# Patient Record
Sex: Female | Born: 1990 | Race: White | Hispanic: No | Marital: Single | Smoking: Current every day smoker
Health system: Southern US, Community
[De-identification: ages and names within clinical notes are randomized; demographics above are authoritative.]

## PROBLEM LIST (undated history)

## (undated) DIAGNOSIS — J982 Interstitial emphysema: Secondary | ICD-10-CM

## (undated) DIAGNOSIS — F1910 Other psychoactive substance abuse, uncomplicated: Secondary | ICD-10-CM

## (undated) DIAGNOSIS — J129 Viral pneumonia, unspecified: Secondary | ICD-10-CM

## (undated) DIAGNOSIS — F419 Anxiety disorder, unspecified: Secondary | ICD-10-CM

## (undated) HISTORY — DX: Interstitial emphysema: J98.2

## (undated) HISTORY — DX: Viral pneumonia, unspecified: J12.9

## (undated) HISTORY — DX: Anxiety disorder, unspecified: F41.9

## (undated) HISTORY — DX: Other psychoactive substance abuse, uncomplicated: F19.10

---
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
## 2012-12-29 ENCOUNTER — Telehealth: Payer: Self-pay | Admitting: Certified Nurse Midwife

## 2012-12-29 NOTE — Telephone Encounter (Signed)
Spoke with pt about bump. Pt noticed it about 1-2 weeks ago, then it went away. It came back a little bigger, pt squeezed it and something came out, but it hasn't gone away. Pt denies itch and it is only sore "when she messes with it." Offered OV tomorrow, but pt declined. Sched OV 01-03-13 with DL at 16:10.

## 2012-12-29 NOTE — Telephone Encounter (Signed)
Patient wants to ask nurse about a bump she has in her pelvic area. Declined appointment.

## 2012-12-30 ENCOUNTER — Encounter: Payer: Self-pay | Admitting: Certified Nurse Midwife

## 2013-01-03 ENCOUNTER — Ambulatory Visit (INDEPENDENT_AMBULATORY_CARE_PROVIDER_SITE_OTHER): Payer: BC Managed Care – PPO | Admitting: Certified Nurse Midwife

## 2013-01-03 ENCOUNTER — Encounter: Payer: Self-pay | Admitting: Certified Nurse Midwife

## 2013-01-03 VITALS — BP 102/62 | HR 80 | Resp 16 | Ht 63.5 in | Wt 122.0 lb

## 2013-01-03 DIAGNOSIS — B3731 Acute candidiasis of vulva and vagina: Secondary | ICD-10-CM

## 2013-01-03 DIAGNOSIS — L678 Other hair color and hair shaft abnormalities: Secondary | ICD-10-CM

## 2013-01-03 DIAGNOSIS — L731 Pseudofolliculitis barbae: Secondary | ICD-10-CM

## 2013-01-03 DIAGNOSIS — N760 Acute vaginitis: Secondary | ICD-10-CM

## 2013-01-03 DIAGNOSIS — B373 Candidiasis of vulva and vagina: Secondary | ICD-10-CM

## 2013-01-03 DIAGNOSIS — L738 Other specified follicular disorders: Secondary | ICD-10-CM

## 2013-01-03 MED ORDER — NYSTATIN-TRIAMCINOLONE 100000-0.1 UNIT/GM-% EX CREA: TOPICAL_CREAM | Freq: Two times a day (BID) | CUTANEOUS | Status: DC

## 2013-01-03 MED ORDER — METRONIDAZOLE 0.75 % VA GEL: 1.0000 | Freq: Every day | VAGINAL | Status: DC

## 2013-01-03 NOTE — Progress Notes (Signed)
21 y.o.SingleCaucasian female [id number] with a 14 day(s) history of the following:burning, discharge described as grey and milky and also has resolving vaginal bump Sexually active: yes Last sexual activity:10days ago. Pt also reports the following associated symptoms: slight pain with sexual activity. Patient has nottried over the counter treatment. No STD screening needed. No new personal products or thong use.  O: Healthy WDWN female Affect: normal, orientation x3    Exam:  ZOX:[id number], Urethra, Skene's normal, resolving ingrown hair sebaceous cyst noted in left crease of groin area. Not red, soft,hair noted in center. No enlarged lymph nodes                JXB:[id number]: copious, malodorous and grey in color, pH 5.0, wet prep done                Cx:  normal appearance and non tender                Uterus:normal size, non-tender, normal shape and consistency                Adnexa: normal adnexa and no mass, fullness, tenderness   Wet Prep shows: Positive skin yeast, negative vaginal yeast, positive for BV, negative for trich   A:Yeast dermatitis BV Resolving ingrown hair sebaceous cyst   P: Reviewed findings Rx Mycolog see order Rx Metrogel see order Discussed findings and instructed not to keep  squeezing area, rather soak with epsom salts until resolves.  Warning signs given.  Discussed not shaving frequently and clipping in this area to keep from occurring again. Questions addressed.  RV prn     Reviewed, TL

## 2013-02-10 DIAGNOSIS — J982 Interstitial emphysema: Secondary | ICD-10-CM

## 2013-02-10 DIAGNOSIS — J129 Viral pneumonia, unspecified: Secondary | ICD-10-CM

## 2013-02-10 HISTORY — DX: Viral pneumonia, unspecified: J12.9

## 2013-02-10 HISTORY — DX: Interstitial emphysema: J98.2

## 2013-03-03 ENCOUNTER — Emergency Department (HOSPITAL_COMMUNITY): Payer: BC Managed Care – PPO

## 2013-03-03 ENCOUNTER — Inpatient Hospital Stay (HOSPITAL_COMMUNITY)
Admission: EM | Admit: 2013-03-03 | Discharge: 2013-03-09 | DRG: 541 | Disposition: A | Discharge status: Home / Self Care | Payer: BC Managed Care – PPO | Attending: Critical Care Medicine | Admitting: Critical Care Medicine

## 2013-03-03 ENCOUNTER — Encounter (HOSPITAL_COMMUNITY): Payer: Self-pay

## 2013-03-03 DIAGNOSIS — R0682 Tachypnea, not elsewhere classified: Secondary | ICD-10-CM

## 2013-03-03 DIAGNOSIS — F411 Generalized anxiety disorder: Secondary | ICD-10-CM | POA: Diagnosis present

## 2013-03-03 DIAGNOSIS — B9789 Other viral agents as the cause of diseases classified elsewhere: Secondary | ICD-10-CM | POA: Diagnosis present

## 2013-03-03 DIAGNOSIS — J1289 Other viral pneumonia: Secondary | ICD-10-CM | POA: Diagnosis present

## 2013-03-03 DIAGNOSIS — E876 Hypokalemia: Secondary | ICD-10-CM | POA: Diagnosis present

## 2013-03-03 DIAGNOSIS — J45909 Unspecified asthma, uncomplicated: Secondary | ICD-10-CM | POA: Diagnosis present

## 2013-03-03 DIAGNOSIS — J939 Pneumothorax, unspecified: Secondary | ICD-10-CM | POA: Diagnosis present

## 2013-03-03 DIAGNOSIS — F17200 Nicotine dependence, unspecified, uncomplicated: Secondary | ICD-10-CM | POA: Diagnosis present

## 2013-03-03 DIAGNOSIS — J982 Interstitial emphysema: Principal | ICD-10-CM | POA: Diagnosis present

## 2013-03-03 DIAGNOSIS — R0902 Hypoxemia: Secondary | ICD-10-CM

## 2013-03-03 DIAGNOSIS — J9383 Other pneumothorax: Secondary | ICD-10-CM | POA: Diagnosis present

## 2013-03-03 DIAGNOSIS — R0603 Acute respiratory distress: Secondary | ICD-10-CM

## 2013-03-03 DIAGNOSIS — R000 Tachycardia, unspecified: Secondary | ICD-10-CM

## 2013-03-03 DIAGNOSIS — R509 Fever, unspecified: Secondary | ICD-10-CM

## 2013-03-03 DIAGNOSIS — J189 Pneumonia, unspecified organism: Secondary | ICD-10-CM | POA: Diagnosis present

## 2013-03-03 DIAGNOSIS — I498 Other specified cardiac arrhythmias: Secondary | ICD-10-CM | POA: Diagnosis not present

## 2013-03-03 DIAGNOSIS — J9600 Acute respiratory failure, unspecified whether with hypoxia or hypercapnia: Secondary | ICD-10-CM | POA: Diagnosis present

## 2013-03-03 LAB — CBC WITH DIFFERENTIAL/PLATELET
Eosinophils Absolute: 0 10*3/uL (ref 0.0–0.7)
Hemoglobin: 14.1 g/dL (ref 12.0–15.0)
Lymphocytes Relative: 5 % — ABNORMAL LOW (ref 12–46)
Lymphs Abs: 0.9 10*3/uL (ref 0.7–4.0)
MCH: 30.8 pg (ref 26.0–34.0)
Monocytes Relative: 1 % — ABNORMAL LOW (ref 3–12)
Neutrophils Relative %: 93 % — ABNORMAL HIGH (ref 43–77)
RBC: 4.58 MIL/uL (ref 3.87–5.11)

## 2013-03-03 LAB — URINALYSIS, ROUTINE W REFLEX MICROSCOPIC
Glucose, UA: NEGATIVE mg/dL
Ketones, ur: 40 mg/dL — AB
Leukocytes, UA: NEGATIVE
Nitrite: NEGATIVE
Protein, ur: 100 mg/dL — AB
Urobilinogen, UA: 0.2 mg/dL (ref 0.0–1.0)

## 2013-03-03 LAB — BASIC METABOLIC PANEL
Calcium: 9.4 mg/dL (ref 8.4–10.5)
Creatinine, Ser: 0.55 mg/dL (ref 0.50–1.10)
GFR calc Af Amer: >90 mL/min (ref >90)
Potassium: 3.1 mEq/L — ABNORMAL LOW (ref 3.5–5.1)

## 2013-03-03 LAB — LACTIC ACID, PLASMA: Lactic Acid, Venous: 3.7 mmol/L — ABNORMAL HIGH (ref 0.5–2.2)

## 2013-03-03 MED ORDER — DEXTROSE 5 % IV SOLN
1.0000 g | Freq: Once | INTRAVENOUS | Status: AC
  Administered 2013-03-03: 1 g via INTRAVENOUS
  Filled 2013-03-03: qty 10

## 2013-03-03 MED ORDER — IOHEXOL 300 MG/ML  SOLN
80.0000 mL | Freq: Once | INTRAMUSCULAR | Status: AC | PRN
  Administered 2013-03-03: 75 mL via INTRAVENOUS

## 2013-03-03 MED ORDER — MORPHINE SULFATE 4 MG/ML IJ SOLN
4.0000 mg | Freq: Once | INTRAMUSCULAR | Status: AC
  Administered 2013-03-03: 4 mg via INTRAVENOUS
  Filled 2013-03-03: qty 1

## 2013-03-03 MED ORDER — CEFTRIAXONE SODIUM 1 G IJ SOLR
1.0000 g | Freq: Once | INTRAMUSCULAR | Status: DC
  Filled 2013-03-03: qty 10

## 2013-03-03 MED ORDER — ALBUTEROL SULFATE (5 MG/ML) 0.5% IN NEBU
10.0000 mg | INHALATION_SOLUTION | Freq: Once | RESPIRATORY_TRACT | Status: AC
  Administered 2013-03-04: 10 mg via RESPIRATORY_TRACT
  Filled 2013-03-03: qty 2

## 2013-03-03 MED ORDER — SODIUM CHLORIDE 0.9 % IV BOLUS (SEPSIS)
1000.0000 mL | Freq: Once | INTRAVENOUS | Status: AC
  Administered 2013-03-03: 1000 mL via INTRAVENOUS

## 2013-03-03 NOTE — ED Provider Notes (Signed)
CSN: [id number]     Arrival date & time 03/03/13  2039 History     First MD Initiated Contact with Patient 03/03/13 2046     Chief Complaint  Patient presents with  . Shortness of Breath   (Consider location/radiation/quality/duration/timing/severity/associated sxs/prior Treatment) Patient is a 22 y.o. female presenting with shortness of breath. The history is provided by the patient and a relative.  Shortness of Breath Severity:  Moderate Onset quality:  Gradual Duration:  3 days Timing:  Constant Progression:  Worsening Chronicity:  New Relieved by:  Inhaler and rest Worsened by:  Activity, deep breathing and exertion Ineffective treatments:  None tried Associated symptoms: chest pain, cough and fever   Associated symptoms: no abdominal pain, no diaphoresis, no ear pain, no headaches, no neck pain, no rash, no sore throat, no vomiting and no wheezing     Past Medical History  Diagnosis Date  . Anxiety    History reviewed. No pertinent past surgical history. Family History  Problem Relation Age of Onset  . Hypertension Mother   . Diabetes Father   . Cancer Paternal Grandmother     liver/colon   History  Substance Use Topics  . Smoking status: Current Every Day Smoker -- 0.50 packs/day    Types: Cigarettes  . Smokeless tobacco: Not on file  . Alcohol Use: No   OB History   Grav Para Term Preterm Abortions TAB SAB Ect Mult Living   0 0 0 0 0 0 0 0 0 0      Review of Systems  Constitutional: Positive for fever and chills. Negative for diaphoresis and fatigue.  HENT: Negative for ear pain, congestion, sore throat, facial swelling, mouth sores, trouble swallowing, neck pain and neck stiffness.   Eyes: Negative.   Respiratory: Positive for cough, chest tightness and shortness of breath. Negative for apnea and wheezing.   Cardiovascular: Positive for chest pain and palpitations. Negative for leg swelling.  Gastrointestinal: Negative for nausea, vomiting, abdominal  pain, diarrhea and abdominal distention.  Genitourinary: Negative for hematuria, flank pain, vaginal discharge, difficulty urinating and menstrual problem.  Musculoskeletal: Negative for back pain and gait problem.  Skin: Negative for rash and wound.  Neurological: Negative for dizziness, tremors, seizures, syncope, facial asymmetry, numbness and headaches.  Psychiatric/Behavioral: Negative.   All other systems reviewed and are negative.    Allergies  Sulfa antibiotics  Home Medications   Current Outpatient Rx  Name  Route  Sig  Dispense  Refill  . acetaminophen (TYLENOL) 500 MG tablet   Oral   Take 500 mg by mouth every 6 (six) hours as needed for pain.         Marland Kitchen albuterol (PROVENTIL HFA;VENTOLIN HFA) 108 (90 BASE) MCG/ACT inhaler   Inhalation   Inhale 2 puffs into the lungs every 4 (four) hours as needed for wheezing.         Marland Kitchen azithromycin (ZITHROMAX Z-PAK) 250 MG tablet   Oral   Take 250-500 mg by mouth See admin instructions. Take 500 mg (2 tablets) on day 1, then 250 mg (1 tablet)  on days 2-5         . etonogestrel-ethinyl estradiol (NUVARING) 0.12-0.015 MG/24HR vaginal ring   Vaginal   Place 1 each vaginally every 28 (twenty-eight) days. Insert vaginally and leave in place for 3 consecutive weeks, then remove for 1 week.         Marland Kitchen ibuprofen (ADVIL,MOTRIN) 200 MG tablet   Oral   Take 400 mg by mouth  every 6 (six) hours as needed for pain.         . predniSONE (STERAPRED UNI-PAK) 10 MG tablet   Oral   Take 10-60 mg by mouth See admin instructions. Take 60 mg (6 tablets) on day 1, 50 mg (5 tablets) on day 2, 40 mg (4 tablets) on day 3, 30 mg (3 tablets) on day 4, 20 mg (2 tablets) on day 5 and 10 mg (1 tablet) on day 6          BP 127/61  Pulse 142  Temp(Src) 98.7 F (37.1 C)  Resp 24  SpO2 97%  LMP 02/19/2013 Physical Exam  Nursing note and vitals reviewed. Constitutional: She is oriented to person, place, and time. She appears well-developed and  well-nourished. No distress.  HENT:  Head: Normocephalic and atraumatic.  Right Ear: External ear normal.  Left Ear: External ear normal.  Nose: Nose normal.  Mouth/Throat: Oropharynx is clear and moist. No oropharyngeal exudate.  Eyes: Conjunctivae and EOM are normal. Pupils are equal, round, and reactive to light. Right eye exhibits no discharge. Left eye exhibits no discharge.  Neck: Normal range of motion. Neck supple. No JVD present. No tracheal deviation present. No thyromegaly present.  Cardiovascular: Regular rhythm, normal heart sounds and intact distal pulses.  Tachycardia present.  Exam reveals no gallop and no friction rub.   No murmur heard. Pulmonary/Chest: Accessory muscle usage present. Tachypnea noted. She is in respiratory distress. She has wheezes. She has rhonchi. She has rales. She exhibits no tenderness.  Diffuse loud breath sounds in both lungs throughout  Abdominal: Soft. Bowel sounds are normal. She exhibits no distension. There is no tenderness. There is no rebound and no guarding.  Musculoskeletal: Normal range of motion.  Lymphadenopathy:    She has no cervical adenopathy.  Neurological: She is alert and oriented to person, place, and time. No cranial nerve deficit. Coordination normal.  Skin: Skin is warm. No rash noted. She is not diaphoretic.  Psychiatric: She has a normal mood and affect. Her behavior is normal. Judgment and thought content normal.    ED Course   Procedures (including critical care time)  Labs Reviewed  CBC WITH DIFFERENTIAL - Abnormal; Notable for the following:    WBC 17.3 (*)    Neutrophils Relative % 93 (*)    Neutro Abs 16.1 (*)    Lymphocytes Relative 5 (*)    Monocytes Relative 1 (*)    All other components within normal limits  BASIC METABOLIC PANEL - Abnormal; Notable for the following:    Potassium 3.1 (*)    CO2 15 (*)    Glucose, Bld 158 (*)    All other components within normal limits  LACTIC ACID, PLASMA - Abnormal;  Notable for the following:    Lactic Acid, Venous 3.7 (*)    All other components within normal limits  URINALYSIS, ROUTINE W REFLEX MICROSCOPIC - Abnormal; Notable for the following:    Specific Gravity, Urine 1.039 (*)    Ketones, ur 40 (*)    Protein, ur 100 (*)    All other components within normal limits  URINE MICROSCOPIC-ADD ON - Abnormal; Notable for the following:    Squamous Epithelial / LPF FEW (*)    Bacteria, UA FEW (*)    All other components within normal limits  CULTURE, BLOOD (ROUTINE X 2)  CULTURE, BLOOD (ROUTINE X 2)  RAPID HIV SCREEN ([facility])  POCT PREGNANCY, URINE   Dg Chest 2 View  03/03/2013  CLINICAL DATA:  Shortness of breath. Smoker.  EXAM: CHEST  2 VIEW  COMPARISON:  None.  FINDINGS: Normal-sized heart. Patchy interstitial and airspace opacity in both lungs. Pneumomediastinum. Minimal scoliosis. No pleural fluid.  IMPRESSION: 1. Pneumomediastinum.  2. Bilateral interstitial and alveolar pneumonitis/pneumonia. This appearance can be seen with pneumocystis carinii pneumonia.   Electronically Signed   By: Gordan Payment   On: 03/03/2013 22:31   Ct Chest W Contrast  03/04/2013   CLINICAL DATA:  Wheezing. Pneumomediastinum. Interstitial and alveolar opacities on chest radiographs earlier today.  EXAM: CT CHEST WITH CONTRAST  TECHNIQUE: Multidetector CT imaging of the chest was performed during intravenous contrast administration.  CONTRAST:  75mL OMNIPAQUE IOHEXOL 300 MG/ML  SOLN  COMPARISON:  Chest radiographs obtained earlier today.  FINDINGS: Extensive pneumomediastinum. No pneumothorax. Patchy interstitial and alveolar opacities an both lungs, involving all lobes, most pronounced in the upper lobes and perihilar regions. No enlarged lymph nodes or pleural fluid. Normal sized heart. Multiple thoracic vertebral bodies have endplate depressions producing an H shape of the vertebral body.  IMPRESSION: 1. Extensive pneumomediastinum without pneumothorax. This extends into the  lower neck. 2. Bilateral multilobar pneumonitis. This is less patchy than normally seen with pneumocystis carinii pneumonia and, although this remains a possibility. Other possibilities include other infectious processes and drug reaction. 3. H-shaped vertebral bodies. This can be seen with sickle cell disease.   Electronically Signed   By: Gordan Payment   On: 03/04/2013 00:32   1. Pneumonia, community acquired   2. Hypoxia   3. Respiratory distress   4. Pneumomediastinum   5. Tachycardia   6. Tachypnea   7. Fever     MDM  22 yr old F pt w/ PMH of smoking here with SOB and tachypnea. Patient says she has been feelign worsening shortness of breath and CP for the past 4 days. Patient says she has had fever for the past 2 days. She went to see her PCP who thought she had bronchitis and prescribed prednisone, albuterol and azithromycin. She says she has not improved. Patient has sinus tachycardia to 150's breathing nearly 30 x per minute with diffuse rales, rhonchi and wheezing and looks to not be feeling well. Patient lungs sounds improved with albuterol and atrovent. Patient got solumedrol from EMS. Patient still tachycardic but it has improved with fluids. Patient with no signs of end organ damage or hypotension. Patient does have pneumomediastinum. This was discussed with Ct surgeon who recommends doing CT of the chest with contrast. Patient also with bilateral pneumonitis possibly from infections like PCP. Will order stat HIV screen and will tx for serious community acquired PNA with rocephin. Patient with leukocytosis, normal urine, sating 100% on 3 L [place]. Elevated LA  CT shows pneumomediastinum and multilobar PNA. Patient improving with continuous albuterol but still with tachypnea and tachycardia. Will admit the patient to the stepdown unit.   Date: 03/03/2013  Rate: 145  Rhythm: sinus tachycardia  QRS Axis: normal  Intervals: normal  ST/T Wave abnormalities: normal  Conduction  Disutrbances:none  Narrative Interpretation:   Old EKG Reviewed: none available  Case discussed with Dr. Benjamin Stain, MD 03/04/13 [phone]

## 2013-03-03 NOTE — ED Notes (Signed)
2045  Pt arrived to ER via EMS from [facility] with SOB.  Pt was seen there on Thursday morning and was diagnosed with bronchitis and given z-pack and rescue inhaler.  Pt has gotten worse since then and went back to the office today and was given 80 solumedrol IM and called EMS and EMS give the pt 10 albuterol and 1 Atrovent and 45 solumedrol IV and 1000 tylenol.  Pt wheezing and diminished in lower lobes.  Family at bedside

## 2013-03-04 ENCOUNTER — Inpatient Hospital Stay (HOSPITAL_COMMUNITY): Payer: BC Managed Care – PPO

## 2013-03-04 ENCOUNTER — Encounter (HOSPITAL_COMMUNITY): Payer: Self-pay | Admitting: Internal Medicine

## 2013-03-04 DIAGNOSIS — J982 Interstitial emphysema: Secondary | ICD-10-CM | POA: Diagnosis present

## 2013-03-04 DIAGNOSIS — J9600 Acute respiratory failure, unspecified whether with hypoxia or hypercapnia: Secondary | ICD-10-CM | POA: Diagnosis present

## 2013-03-04 DIAGNOSIS — R0602 Shortness of breath: Secondary | ICD-10-CM

## 2013-03-04 DIAGNOSIS — J189 Pneumonia, unspecified organism: Secondary | ICD-10-CM | POA: Diagnosis present

## 2013-03-04 DIAGNOSIS — R079 Chest pain, unspecified: Secondary | ICD-10-CM

## 2013-03-04 DIAGNOSIS — E876 Hypokalemia: Secondary | ICD-10-CM | POA: Diagnosis present

## 2013-03-04 LAB — RAPID URINE DRUG SCREEN, HOSP PERFORMED
Amphetamines: NOT DETECTED
Barbiturates: NOT DETECTED
Barbiturates: NOT DETECTED
Cocaine: NOT DETECTED
Cocaine: NOT DETECTED
Opiates: NOT DETECTED
Tetrahydrocannabinol: POSITIVE — AB

## 2013-03-04 LAB — RAPID HIV SCREEN (WH-MAU): Rapid HIV Screen: NONREACTIVE

## 2013-03-04 LAB — STREP PNEUMONIAE URINARY ANTIGEN: Strep Pneumo Urinary Antigen: NEGATIVE

## 2013-03-04 MED ORDER — LEVALBUTEROL HCL 0.63 MG/3ML IN NEBU
0.6300 mg | INHALATION_SOLUTION | Freq: Four times a day (QID) | RESPIRATORY_TRACT | Status: DC
  Administered 2013-03-04 – 2013-03-05 (×5): 0.63 mg via RESPIRATORY_TRACT
  Filled 2013-03-04 (×8): qty 3

## 2013-03-04 MED ORDER — HYDROCODONE-HOMATROPINE 5-1.5 MG/5ML PO SYRP: 5.0000 mL | ORAL_SOLUTION | ORAL | Status: DC | PRN

## 2013-03-04 MED ORDER — OXYCODONE HCL 5 MG PO TABS
5.0000 mg | ORAL_TABLET | ORAL | Status: DC | PRN
  Administered 2013-03-04 – 2013-03-07 (×5): 5 mg via ORAL
  Filled 2013-03-04 (×5): qty 1

## 2013-03-04 MED ORDER — POTASSIUM CHLORIDE CRYS ER 20 MEQ PO TBCR
40.0000 meq | EXTENDED_RELEASE_TABLET | Freq: Two times a day (BID) | ORAL | Status: AC
  Administered 2013-03-04 (×2): 40 meq via ORAL
  Filled 2013-03-04 (×2): qty 2

## 2013-03-04 MED ORDER — METHYLPREDNISOLONE SODIUM SUCC 125 MG IJ SOLR
60.0000 mg | Freq: Four times a day (QID) | INTRAMUSCULAR | Status: DC
  Administered 2013-03-04 – 2013-03-06 (×9): 60 mg via INTRAVENOUS
  Filled 2013-03-04 (×13): qty 0.96

## 2013-03-04 MED ORDER — HYDROCODONE-ACETAMINOPHEN 7.5-325 MG/15ML PO SOLN
10.0000 mL | ORAL | Status: DC | PRN
  Administered 2013-03-04 – 2013-03-07 (×7): 10 mL via ORAL
  Filled 2013-03-04 (×7): qty 15

## 2013-03-04 MED ORDER — DEXTROSE 5 % IV SOLN
500.0000 mg | INTRAVENOUS | Status: DC
  Administered 2013-03-04 – 2013-03-07 (×4): 500 mg via INTRAVENOUS
  Filled 2013-03-04 (×4): qty 500

## 2013-03-04 MED ORDER — BIOTENE DRY MOUTH MT LIQD
15.0000 mL | Freq: Two times a day (BID) | OROMUCOSAL | Status: DC
  Administered 2013-03-04 – 2013-03-08 (×9): 15 mL via OROMUCOSAL

## 2013-03-04 MED ORDER — AZITHROMYCIN 250 MG PO TABS
250.0000 mg | ORAL_TABLET | ORAL | Status: DC
  Administered 2013-03-04: 500 mg via ORAL
  Filled 2013-03-04: qty 2

## 2013-03-04 MED ORDER — HYDROCOD POLST-CHLORPHEN POLST 10-8 MG/5ML PO LQCR
5.0000 mL | Freq: Two times a day (BID) | ORAL | Status: DC | PRN
  Administered 2013-03-04: 5 mL via ORAL
  Filled 2013-03-04: qty 5

## 2013-03-04 MED ORDER — ACETAMINOPHEN 325 MG PO TABS: 650.0000 mg | ORAL_TABLET | Freq: Four times a day (QID) | ORAL | Status: DC | PRN

## 2013-03-04 MED ORDER — HYDROMORPHONE HCL PF 1 MG/ML IJ SOLN
1.0000 mg | Freq: Once | INTRAMUSCULAR | Status: AC
  Administered 2013-03-04: 1 mg via INTRAVENOUS
  Filled 2013-03-04: qty 1

## 2013-03-04 MED ORDER — HYDROMORPHONE HCL PF 1 MG/ML IJ SOLN: 1.0000 mg | INTRAMUSCULAR | Status: DC | PRN

## 2013-03-04 MED ORDER — MORPHINE SULFATE 2 MG/ML IJ SOLN
2.0000 mg | INTRAMUSCULAR | Status: DC | PRN
  Administered 2013-03-04: 2 mg via INTRAVENOUS
  Filled 2013-03-04 (×2): qty 1

## 2013-03-04 MED ORDER — BENZONATATE 100 MG PO CAPS
200.0000 mg | ORAL_CAPSULE | Freq: Three times a day (TID) | ORAL | Status: DC | PRN
  Administered 2013-03-04 (×2): 200 mg via ORAL
  Filled 2013-03-04 (×2): qty 2

## 2013-03-04 NOTE — Progress Notes (Signed)
PCCM  Pt seen in f/u after 2AM admit  Pt in no distress.  C/o cough and wheeze.  No prior asthma hx.  No hx of sick exposures except boyfriend with a cold.  Drug screen only pos THC. Has bilat infiltrates and pneumomediastinum   WBC elevated . No fever Pneumomediastinum  Chest: exp wheeze and rales  IMP Pneumonititis ?viral  Asthma exac Pneumomediastinum  Plan Add steroids  BD on sched IV azithro and rocephin Check strep pneumo and legionella urine antigen  Shan Levans Beeper  [phone]  Cell  [phone]  If no response or cell goes to voicemail, call beeper [phone]

## 2013-03-04 NOTE — ED Provider Notes (Signed)
I saw and evaluated the patient, reviewed the resident's note and I agree with the findings and plan and agree with their ECG interpretation. Patient with chest pain shortness of breath. Likely pneumonia x-ray. Has pneumomediastinum. Admitted to the hospital after discussion with cardiothoracic surgery. [employer]. Rubin Payor, MD 03/04/13 2259

## 2013-03-04 NOTE — H&P (Signed)
PULMONARY  / CRITICAL CARE MEDICINE  Name: Missey Hasley MRN: [id number] DOB: 1991-05-07    ADMISSION DATE:  03/03/2013 CONSULTATION DATE:  03/04/2013  REFERRING MD :  Dr. Rubin Payor PRIMARY SERVICE: Emergency Medicine  CHIEF COMPLAINT:  Chest pain or shortness of breath  BRIEF PATIENT DESCRIPTION: 22 year-old female with pneumomediastinum in the setting of bronchitis.  SIGNIFICANT EVENTS / STUDIES:  1. Chest CT 03/04/2013 demonstrates pneumomediastinum.  LINES / TUBES: 1. None  CULTURES: 1. None  ANTIBIOTICS: 1. None  HISTORY OF PRESENT ILLNESS:  Ms. Knisley is a 22 year old female with no significant past medical history who presented to [facility] this evening with chest pain and shortness of breath. The chest pain and shortness of breath began on Wednesday when she developed an "cold" with associated rhinorrhea, sore throat, cough, and wheezing. Her boyfriend was recently sick as well. She presented to her primary care physician who diagnosed her with bronchitis and started her with antibiotics and prednisone. This evening, the pain got worse and decided to present to the emergency room. She denies recent trauma or cocaine use. She has never had a spontaneous pneumothorax.  PAST MEDICAL HISTORY :  Past Medical History  Diagnosis Date  . Anxiety     History reviewed. No pertinent past surgical history.  Prior to Admission medications   Medication Sig Start Date End Date Taking? Authorizing Provider  acetaminophen (TYLENOL) 500 MG tablet Take 500 mg by mouth every 6 (six) hours as needed for pain.   Yes Historical Provider, MD  albuterol (PROVENTIL HFA;VENTOLIN HFA) 108 (90 BASE) MCG/ACT inhaler Inhale 2 puffs into the lungs every 4 (four) hours as needed for wheezing.   Yes Historical Provider, MD  azithromycin (ZITHROMAX Z-PAK) 250 MG tablet Take 250-500 mg by mouth See admin instructions. Take 500 mg (2 tablets) on day 1, then 250 mg (1 tablet)  on days 2-5   Yes  Historical Provider, MD  etonogestrel-ethinyl estradiol (NUVARING) 0.12-0.015 MG/24HR vaginal ring Place 1 each vaginally every 28 (twenty-eight) days. Insert vaginally and leave in place for 3 consecutive weeks, then remove for 1 week.   Yes Historical Provider, MD  ibuprofen (ADVIL,MOTRIN) 200 MG tablet Take 400 mg by mouth every 6 (six) hours as needed for pain.   Yes Historical Provider, MD  predniSONE (STERAPRED UNI-PAK) 10 MG tablet Take 10-60 mg by mouth See admin instructions. Take 60 mg (6 tablets) on day 1, 50 mg (5 tablets) on day 2, 40 mg (4 tablets) on day 3, 30 mg (3 tablets) on day 4, 20 mg (2 tablets) on day 5 and 10 mg (1 tablet) on day 6   Yes Historical Provider, MD    Allergies  Allergen Reactions  . Sulfa Antibiotics Itching    FAMILY HISTORY:  Family History  Problem Relation Age of Onset  . Hypertension Mother   . Diabetes Father   . Cancer Paternal Grandmother     liver/colon    SOCIAL HISTORY:  reports that she has been smoking Cigarettes.  She has been smoking about 0.50 packs per day. She does not have any smokeless tobacco history on file. She reports that she uses illicit drugs. She reports that she does not drink alcohol.  REVIEW OF SYSTEMS:  A 12 system review of systems was conducted and, unless otherwise specified in history of present illness, was negative.  PHYSICAL EXAM  VITAL SIGNS: Temp:  [98.7 F (37.1 C)] 98.7 F (37.1 C) (08/22 2039) Pulse Rate:  [131-152] 143 (  08/23 0215) Resp:  [18-42] 35 (08/23 0215) BP: (102-129)/(41-70) 120/49 mmHg (08/23 0215) SpO2:  [92 %-99 %] 94 % (08/23 0215)  HEMODYNAMICS:    VENTILATOR SETTINGS:    INTAKE / OUTPUT: Intake/Output   None     PHYSICAL EXAMINATION: General:   well-developed well-nourished female in no acute distress Neuro:   nonfocal HEENT:   sclera anicteric, conjunctiva pink, mucous membranes moist, oropharynx clear Neck:   trachea supple in midline, no lymphadenopathy or  JVD Cardiovascular:   regular rate rhythm, normal S1-S2, no murmurs rubs or gallops Lungs:   mild wheezing in the apices bilaterally, normal work of breathing Abdomen:   soft, nontender, nondistended, positive bowel sounds Musculoskeletal:   no clubbing cyanosis or Skin:   no rashes  LABS:  CBC Recent Labs     03/03/13  2107  WBC  17.3*  HGB  14.1  HCT  39.7  PLT  252    Coag's No results found for this basename: APTT, INR,  in the last 72 hours  BMET Recent Labs     03/03/13  2107  NA  139  K  3.1*  CL  105  CO2  15*  BUN  8  CREATININE  0.55  GLUCOSE  158*    Electrolytes Recent Labs     03/03/13  2107  CALCIUM  9.4    Sepsis Markers No results found for this basename: LACTICACIDVEN, PROCALCITON, O2SATVEN,  in the last 72 hours  ABG No results found for this basename: PHART, PCO2ART, PO2ART,  in the last 72 hours  Liver Enzymes No results found for this basename: AST, ALT, ALKPHOS, BILITOT, ALBUMIN,  in the last 72 hours  Cardiac Enzymes No results found for this basename: TROPONINI, PROBNP,  in the last 72 hours  Glucose No results found for this basename: GLUCAP,  in the last 72 hours  Imaging Dg Chest 2 View  03/03/2013   CLINICAL DATA:  Shortness of breath. Smoker.  EXAM: CHEST  2 VIEW  COMPARISON:  None.  FINDINGS: Normal-sized heart. Patchy interstitial and airspace opacity in both lungs. Pneumomediastinum. Minimal scoliosis. No pleural fluid.  IMPRESSION: 1. Pneumomediastinum.  2. Bilateral interstitial and alveolar pneumonitis/pneumonia. This appearance can be seen with pneumocystis carinii pneumonia.   Electronically Signed   By: Gordan Payment   On: 03/03/2013 22:31   Ct Chest W Contrast  03/04/2013   CLINICAL DATA:  Wheezing. Pneumomediastinum. Interstitial and alveolar opacities on chest radiographs earlier today.  EXAM: CT CHEST WITH CONTRAST  TECHNIQUE: Multidetector CT imaging of the chest was performed during intravenous contrast  administration.  CONTRAST:  75mL OMNIPAQUE IOHEXOL 300 MG/ML  SOLN  COMPARISON:  Chest radiographs obtained earlier today.  FINDINGS: Extensive pneumomediastinum. No pneumothorax. Patchy interstitial and alveolar opacities an both lungs, involving all lobes, most pronounced in the upper lobes and perihilar regions. No enlarged lymph nodes or pleural fluid. Normal sized heart. Multiple thoracic vertebral bodies have endplate depressions producing an H shape of the vertebral body.  IMPRESSION: 1. Extensive pneumomediastinum without pneumothorax. This extends into the lower neck. 2. Bilateral multilobar pneumonitis. This is less patchy than normally seen with pneumocystis carinii pneumonia and, although this remains a possibility. Other possibilities include other infectious processes and drug reaction. 3. H-shaped vertebral bodies. This can be seen with sickle cell disease.   Electronically Signed   By: Gordan Payment   On: 03/04/2013 00:32    EKG:  Pending CXR:  Chest radiograph is personally reviewed  by me. It demonstrates pneumomediastinum and mild diffuse groundglass opacities which are also seen on the CT.  ASSESSMENT / PLAN: Principal Problem:   Pneumomediastinum Active Problems:   Hypokalemia   Acute respiratory failure   PULMONARY A: 1. Acute hypoxic respiratory failure: This most likely represents a viral bronchitis versus a developing pneumonia. The severe associated cough likely limited in pneumomediastinum.  P:    Continue ceftriaxone/azithromycin coverage for community-acquired pneumonia  Stop albuterol nebulizers as they provoke cough  Tessalon Perles  Viral respiratory  CARDIOVASCULAR A:  1. Pneumomediastinum: This is most likely a result of severe cough in the setting of her bronchitis/pneumonia. She does have a history of cocaine use, however, indicates that her last use was several months ago. She denies recent trauma. A congenital lesion is possible as well.  P:    Supportive care with pain medication and supplemental oxygen as needed  Cough suppression  RENAL A:  1. Hypokalemia:   P:    Replete  GASTROINTESTINAL A: 1. No acute issue  HEMATOLOGIC A:   1. No acute issue  INFECTIOUS A: 1. No acute issue  ENDOCRINE A:   1. No acute issue  NEUROLOGIC A:  1. No acute issue  BEST PRACTICE / DISPOSITION Level of Care:  ICU Consultants:  Cardiothoracic Code Status:  Full Diet:  Regular DVT Px:  Ambulatory GI Px:  Not indicated Skin Integrity:  Intact Social / Family:  Parents in room with patient  TODAY'S SUMMARY:   I have personally obtained a history, examined the patient, evaluated laboratory and imaging results, formulated the assessment and plan and placed orders.  CRITICAL CARE: The patient is critically ill with multiple organ systems failure and requires high complexity decision making for assessment and support, frequent evaluation and titration of therapies, application of advanced monitoring technologies and extensive interpretation of multiple databases. Critical Care Time devoted to patient care services described in this note is 30 minutes.   Evalyn Casco, MD Pulmonary and Critical Care Medicine [facility] Pager: [phone]  03/04/2013, 2:34 AM

## 2013-03-05 ENCOUNTER — Inpatient Hospital Stay (HOSPITAL_COMMUNITY): Payer: BC Managed Care – PPO

## 2013-03-05 DIAGNOSIS — J982 Interstitial emphysema: Principal | ICD-10-CM

## 2013-03-05 DIAGNOSIS — J189 Pneumonia, unspecified organism: Secondary | ICD-10-CM

## 2013-03-05 DIAGNOSIS — J939 Pneumothorax, unspecified: Secondary | ICD-10-CM | POA: Diagnosis present

## 2013-03-05 DIAGNOSIS — J9600 Acute respiratory failure, unspecified whether with hypoxia or hypercapnia: Secondary | ICD-10-CM

## 2013-03-05 LAB — LEGIONELLA ANTIGEN, URINE

## 2013-03-05 LAB — BASIC METABOLIC PANEL
CO2: 22 mEq/L (ref 19–32)
GFR calc non Af Amer: >90 mL/min (ref >90)
Glucose, Bld: 135 mg/dL — ABNORMAL HIGH (ref 70–99)
Potassium: 3.6 mEq/L (ref 3.5–5.1)
Sodium: 136 mEq/L (ref 135–145)

## 2013-03-05 LAB — CBC
Hemoglobin: 12.3 g/dL (ref 12.0–15.0)
MCH: 30.1 pg (ref 26.0–34.0)
MCV: 86.8 fL (ref 78.0–100.0)
RBC: 4.08 MIL/uL (ref 3.87–5.11)

## 2013-03-05 MED ORDER — DEXTROSE 5 % IV SOLN
1.0000 g | INTRAVENOUS | Status: DC
  Administered 2013-03-05 – 2013-03-07 (×3): 1 g via INTRAVENOUS
  Filled 2013-03-05 (×3): qty 10

## 2013-03-05 MED ORDER — BENZONATATE 100 MG PO CAPS
200.0000 mg | ORAL_CAPSULE | Freq: Three times a day (TID) | ORAL | Status: DC
  Administered 2013-03-05 – 2013-03-08 (×12): 200 mg via ORAL
  Filled 2013-03-05 (×16): qty 2

## 2013-03-05 MED ORDER — LEVALBUTEROL HCL 0.63 MG/3ML IN NEBU
0.6300 mg | INHALATION_SOLUTION | RESPIRATORY_TRACT | Status: DC
  Administered 2013-03-05 – 2013-03-06 (×6): 0.63 mg via RESPIRATORY_TRACT
  Filled 2013-03-05 (×9): qty 3

## 2013-03-05 NOTE — Progress Notes (Signed)
PULMONARY  / CRITICAL CARE MEDICINE  Name: Valerie Castro MRN: [id number] DOB: May 09, 1991    ADMISSION DATE:  03/03/2013 CONSULTATION DATE:  03/04/2013  REFERRING MD :  Dr. Rubin Payor PRIMARY SERVICE: Emergency Medicine  CHIEF COMPLAINT:  Chest pain or shortness of breath  BRIEF PATIENT DESCRIPTION: 22 year-old female with pneumomediastinum in the setting of bronchitis.  SIGNIFICANT EVENTS / STUDIES:  1. Chest CT 03/04/2013 demonstrates pneumomediastinum.  LINES / TUBES: 1. None  CULTURES: 1. Urine strep pneum 8/23>>neg 2. Blood cultures 8/23>>  ANTIBIOTICS: 1. Azithromycin 8/23 2. Rocephin 8/24  Subjective: Pt states feels weaker today. Non prod cough  PHYSICAL EXAM  VITAL SIGNS: Temp:  [97.9 F (36.6 C)-98.9 F (37.2 C)] 98.2 F (36.8 C) (08/24 0732) Pulse Rate:  [90-138] 90 (08/24 0600) Resp:  [18-36] 32 (08/24 0600) BP: (110-151)/(49-78) 119/70 mmHg (08/24 0600) SpO2:  [88 %-96 %] 92 % (08/24 0752) Weight:  [57 kg (125 lb 10.6 oz)] 57 kg (125 lb 10.6 oz) (08/24 0500)  [place] oxygen 3L sats 89-90      INTAKE / OUTPUT: Intake/Output     08/23 0701 - 08/24 0700 08/24 0701 - 08/25 0700   P.O. 150    I.V. (mL/kg) 80 (1.4)    IV Piggyback 250    Total Intake(mL/kg) 480 (8.4)    Urine (mL/kg/hr) 2075 (1.5)    Total Output 2075     Net -1595            PHYSICAL EXAMINATION: General:   well-developed well-nourished female in no acute distress Neuro:   nonfocal HEENT:   sclera anicteric, conjunctiva pink, mucous membranes moist, oropharynx clear Neck:   trachea supple in midline, no lymphadenopathy or JVD Cardiovascular:   regular rate rhythm, normal S1-S2, no murmurs rubs or gallops Lungs:   Increased wheezing diffusely bilaterally Abdomen:   soft, nontender, nondistended, positive bowel sounds Musculoskeletal:   no clubbing cyanosis or Skin:   no rashes  LABS:  CBC Recent Labs     03/03/13  2107  03/05/13  0410  WBC  17.3*  17.5*  HGB  14.1  12.3  HCT   39.7  35.4*  PLT  252  270    Coag's No results found for this basename: APTT, INR,  in the last 72 hours  BMET Recent Labs     03/03/13  2107  03/05/13  0410  NA  139  136  K  3.1*  3.6  CL  105  104  CO2  15*  22  BUN  8  10  CREATININE  0.55  0.49*  GLUCOSE  158*  135*    Electrolytes Recent Labs     03/03/13  2107  03/05/13  0410  CALCIUM  9.4  9.1    Sepsis Markers No results found for this basename: LACTICACIDVEN, PROCALCITON, O2SATVEN,  in the last 72 hours  ABG No results found for this basename: PHART, PCO2ART, PO2ART,  in the last 72 hours  Liver Enzymes No results found for this basename: AST, ALT, ALKPHOS, BILITOT, ALBUMIN,  in the last 72 hours  Cardiac Enzymes No results found for this basename: TROPONINI, PROBNP,  in the last 72 hours  Glucose No results found for this basename: GLUCAP,  in the last 72 hours  Imaging  CXR:  Chest radiograph is personally reviewed by me. It demonstrates pneumomediastinum , tiny apical pneumothoraces and patchy  groundglass opacities which are also seen on the CT.  ASSESSMENT / PLAN: Principal  Problem:   Pneumomediastinum Active Problems:   Hypokalemia   Acute respiratory failure   Pneumonia   PULMONARY A: Acute hypoxic respiratory failure: This most likely represents a viral bronchitis versus a developing viral pneumonia. Pneumomediastinum and tiny apical pneumothoraces P:    Continue ceftriaxone/azithromycin coverage for community-acquired pneumonia  Cont xopenex neb meds increase to q4h  Flutter valve and IS  Tessalon Perles  Viral respiratory  CARDIOVASCULAR A:  1. Pneumomediastinum: This is most likely a result of severe cough in the setting of her bronchitis/pneumonia. She does have a history of cocaine use, however, indicates that her last use was several months ago. She denies recent trauma. A congenital lesion is possible as well.  NOTE drug screen neg except for THC  P:   Supportive  care with pain medication and supplemental oxygen as needed  Cough suppression  RENAL A:  1. Hypokalemia: resolved  P:    monitor  GASTROINTESTINAL A: 1. No acute issue, tol diet ok  HEMATOLOGIC A:   1. No acute issue  INFECTIOUS A: 1. BC neg. Strep pneumo Ag neg.   P:  Cont iv rocephin/azithromycin  ENDOCRINE A:   1. No acute issue  NEUROLOGIC A:  1. No acute issue  I have updated pts parents 8/23 and 8/24 at bedside  TODAY'S SUMMARY:  22 y.o.F with prob viral pneumonia, pneumomediastinum, pneumothoraces, bronchospasm, cough,  Air trapping/leak better, pneumonitis evolving Plan to cont IV ABX, steroids, BDs, cough suppression, add flutter/IS. Keep in ICU.  Not out of woods yet  I have personally obtained a history, examined the patient, evaluated laboratory and imaging results, formulated the assessment and plan and placed orders.  CRITICAL CARE: The patient is critically ill with multiple organ systems failure and requires high complexity decision making for assessment and support, frequent evaluation and titration of therapies, application of advanced monitoring technologies and extensive interpretation of multiple databases. Critical Care Time devoted to patient care services described in this note is 30 minutes.   Dorcas Carrow Beeper  [phone]  Cell  [phone]  If no response or cell goes to voicemail, call beeper [phone] Pulmonary and Critical Care Medicine [facility] Pager: [phone]  03/05/2013, 8:27 AM

## 2013-03-05 NOTE — Progress Notes (Signed)
  Subjective: 22 year old female smoker with a viral pneumonia and pneumomediastinum. Admitted with chest pain, productive cough and shortness of breath. Chest x-rays and CT scan chest all reviewed. She has mild pneumomediastinum and trace right greater than left pneumothorax. Significant inflammation of the distal airways. No prior history of asthma or recurrent pneumonia. No route recent episode of severe emesis heavy lifting or thoracic trauma. Patient is responding to bronchodilators antibiotics and IV steroids. Oxygen saturation satisfactory stable Objective: Vital signs in last 24 hours: Temp:  [98.1 F (36.7 C)-98.9 F (37.2 C)] 98.2 F (36.8 C) (08/24 1200) Pulse Rate:  [90-138] 97 (08/24 1200) Cardiac Rhythm:  [-] Sinus tachycardia (08/24 0800) Resp:  [18-36] 30 (08/24 1200) BP: (110-151)/(53-78) 123/66 mmHg (08/24 1200) SpO2:  [88 %-96 %] 93 % (08/24 1216) Weight:  [125 lb 10.6 oz (57 kg)] 125 lb 10.6 oz (57 kg) (08/24 0500)  Hemodynamic parameters for last 24 hours:    Intake/Output from previous day: 08/23 0701 - 08/24 0700 In: 480 [P.O.:150; I.V.:80; IV Piggyback:250] Out: 2075 [Urine:2075] Intake/Output this shift: Total I/O In: 125 [IV Piggyback:125] Out: 500 [Urine:500]  Exam Young Caucasian female anxious no acute distress No mediastinal crunch on exam No crepitus in the neck Scattered wheezes bilaterally Cardiac rhythm regular without rub  Lab Results:  Recent Labs  03/03/13 2107 03/05/13 0410  WBC 17.3* 17.5*  HGB 14.1 12.3  HCT 39.7 35.4*  PLT 252 270   BMET:  Recent Labs  03/03/13 2107 03/05/13 0410  NA 139 136  K 3.1* 3.6  CL 105 104  CO2 15* 22  GLUCOSE 158* 135*  BUN 8 10  CREATININE 0.55 0.49*  CALCIUM 9.4 9.1    PT/INR: No results found for this basename: LABPROT, INR,  in the last 72 hours ABG No results found for this basename: phart, pco2, po2, hco3, tco2, acidbasedef, o2sat   CBG (last 3)  No results found for this  basename: GLUCAP,  in the last 72 hours  Assessment/Plan: S/P   Pneumomediastinum secondary to bronchopneumonia with coughing and wheezing. Agree with monitoring in ICU and current medical therapy with daily chest x-ray. We'll follow.   LOS: 2 days    VAN TRIGT III,PETER 03/05/2013

## 2013-03-06 ENCOUNTER — Inpatient Hospital Stay (HOSPITAL_COMMUNITY): Payer: BC Managed Care – PPO

## 2013-03-06 DIAGNOSIS — R0609 Other forms of dyspnea: Secondary | ICD-10-CM

## 2013-03-06 DIAGNOSIS — J9383 Other pneumothorax: Secondary | ICD-10-CM

## 2013-03-06 LAB — CBC
HCT: 33.6 % — ABNORMAL LOW (ref 36.0–46.0)
MCH: 29.5 pg (ref 26.0–34.0)
MCV: 86.2 fL (ref 78.0–100.0)
Platelets: 255 10*3/uL (ref 150–400)
RBC: 3.9 MIL/uL (ref 3.87–5.11)

## 2013-03-06 LAB — BASIC METABOLIC PANEL
CO2: 24 mEq/L (ref 19–32)
Calcium: 8.8 mg/dL (ref 8.4–10.5)
Glucose, Bld: 135 mg/dL — ABNORMAL HIGH (ref 70–99)
Sodium: 136 mEq/L (ref 135–145)

## 2013-03-06 MED ORDER — LEVALBUTEROL HCL 0.63 MG/3ML IN NEBU
0.6300 mg | INHALATION_SOLUTION | Freq: Four times a day (QID) | RESPIRATORY_TRACT | Status: DC
  Administered 2013-03-06 – 2013-03-09 (×11): 0.63 mg via RESPIRATORY_TRACT
  Filled 2013-03-06 (×15): qty 3

## 2013-03-06 MED ORDER — POTASSIUM CHLORIDE CRYS ER 20 MEQ PO TBCR
20.0000 meq | EXTENDED_RELEASE_TABLET | ORAL | Status: AC
  Administered 2013-03-06 (×2): 20 meq via ORAL
  Filled 2013-03-06 (×2): qty 1

## 2013-03-06 MED ORDER — LEVALBUTEROL HCL 0.63 MG/3ML IN NEBU
0.6300 mg | INHALATION_SOLUTION | RESPIRATORY_TRACT | Status: DC | PRN
  Filled 2013-03-06: qty 3

## 2013-03-06 MED ORDER — METHYLPREDNISOLONE SODIUM SUCC 40 MG IJ SOLR
20.0000 mg | Freq: Three times a day (TID) | INTRAMUSCULAR | Status: DC
  Administered 2013-03-06 – 2013-03-07 (×3): 20 mg via INTRAVENOUS
  Filled 2013-03-06 (×6): qty 0.5

## 2013-03-06 NOTE — Progress Notes (Signed)
eLink Nursing ICU Electrolyte Replacement Protocol  Patient Name: Valerie Castro DOB: April 09, 1991 MRN: [id number]  Date of Service  03/06/2013   HPI/Events of Note    Recent Labs Lab 03/03/13 2107 03/05/13 0410 03/06/13 0420  NA 139 136 136  K 3.1* 3.6 3.5  CL 105 104 101  CO2 15* 22 24  GLUCOSE 158* 135* 135*  BUN 8 10 13   CREATININE 0.55 0.49* 0.49*  CALCIUM 9.4 9.1 8.8    Estimated Creatinine Clearance: 96.9 ml/min (by C-G formula based on Cr of 0.49).  Intake/Output     08/24 0701 - 08/25 0700   P.O. 440   IV Piggyback 125   Total Intake(mL/kg) 565 (9.9)   Urine (mL/kg/hr) 900 (0.7)   Total Output 900   Net -335        - I/O DETAILED x24h      - I/O THIS SHIFT    ASSESSMENT   eICURN Interventions  K+ 3.5 Electrolyte protocol criteria met. Electrolytes replaced per protocol. MD notified.     ASSESSMENT: MAJOR ELECTROLYTE    Merita Norton 03/06/2013, 6:00 AM

## 2013-03-06 NOTE — Progress Notes (Signed)
PULMONARY  / CRITICAL CARE MEDICINE  Name: Lee-Ann Gal MRN: [id number] DOB: [birth year]    ADMISSION DATE:  03/03/2013 CONSULTATION DATE:  03/04/2013  REFERRING MD :  Dr. Rubin Payor PRIMARY SERVICE: Emergency Medicine  CHIEF COMPLAINT:  Chest pain or shortness of breath  BRIEF PATIENT DESCRIPTION: 22 year-old female with pneumomediastinum in the setting of bronchitis/pneumonia.  UDS positive for THC.  No prior hx of asthma.  SIGNIFICANT EVENTS: 8/24 TCTS consulted 8/25 Transfer to SDU  STUDIES:  Chest CT 8/23 >> b/l ASD, pneumomediastinum.  LINES / TUBES None  CULTURES: Blood 8/22 >>  HIV 8/22 >> negative Urine legionella Ag 8/23 >> negative Urine pneumococcal Ag 8/23 >> negative Respiratory viral panel 8/25 >>   ANTIBIOTICS: Rocephin 8/22 >> Zithromax 8/22 >>  Subjective: Still has cough with sputum.  Still has wheeze.  Chest pain better.  Gets winded with activity, and RN notes oxygen desaturation with activity.  PHYSICAL EXAM VITAL SIGNS: Temp:  [97.9 F (36.6 C)-99.4 F (37.4 C)] 98 F (36.7 C) (08/25 0816) Pulse Rate:  [64-123] 123 (08/25 1000) Resp:  [20-37] 23 (08/25 1000) BP: (119-139)/(58-75) 129/58 mmHg (08/25 1000) SpO2:  [87 %-95 %] 93 % (08/25 1000) Weight:  [128 lb 1.4 oz (58.1 kg)] 128 lb 1.4 oz (58.1 kg) (08/25 0500) 4 liters [place]  INTAKE / OUTPUT: Intake/Output     08/24 0701 - 08/25 0700 08/25 0701 - 08/26 0700   P.O. 440    I.V. (mL/kg)     IV Piggyback 175    Total Intake(mL/kg) 615 (10.6)    Urine (mL/kg/hr) 900 (0.6)    Total Output 900     Net -285          Urine Occurrence 1 x 1 x     PHYSICAL EXAMINATION: General: No distress Neuro: alert, normal strength HEENT: No sinus tenderness Cardiovascular: regular, tachycardic Lungs: b/l rhonchi and wheeze Abdomen:   soft, nontender Musculoskeletal: no edema Skin:   no rashes  LABS:  CBC Recent Labs     03/03/13  2107  03/05/13  0410  03/06/13  0420  WBC  17.3*  17.5*  10.6*   HGB  14.1  12.3  11.5*  HCT  39.7  35.4*  33.6*  PLT  252  270  255   BMET Recent Labs     03/03/13  2107  03/05/13  0410  03/06/13  0420  NA  139  136  136  K  3.1*  3.6  3.5  CL  105  104  101  CO2  15*  22  24  BUN  8  10  13   CREATININE  0.55  0.49*  0.49*  GLUCOSE  158*  135*  135*    Electrolytes Recent Labs     03/03/13  2107  03/05/13  0410  03/06/13  0420  CALCIUM  9.4  9.1  8.8   Imaging Dg Chest Port 1 View  03/06/2013   *RADIOLOGY REPORT*  Clinical Data: Pneumomediastinum.  Pneumonitis.  Pneumothorax.  PORTABLE CHEST - 1 VIEW  Comparison: Multiple priors dating back to 03/04/2013.  Findings: Bilateral perihilar airspace disease remains present, unchanged.  The pneumothoraces seen on prior exams are no longer identified.  Pneumomediastinum remains visible.  There are no secondary signs of pneumothorax.  Soft tissue emphysema appears similar to prior. Monitoring leads are projected over the chest. Cardiopericardial silhouette appears similar.  IMPRESSION:  1.  Nonvisualization of the bilateral pneumothoraces. 2.  Unchanged pneumomediastinum.  3.  Bilateral perihilar airspace disease is unchanged.   Original Report Authenticated By: Andreas Newport, M.D.   Dg Chest Port 1 View  03/05/2013   *RADIOLOGY REPORT*  Clinical Data: Pneumomediastinum.  Tiny pneumothoraces.  Follow-up.  PORTABLE CHEST - 1 VIEW  Comparison: 03/05/2013 and 05/27 the  Findings: Tiny left apical pneumothorax is stable.  No right pneumothorax is seen.  Pneumomediastinum and subcutaneous air is unchanged.  Patchy areas of perihilar airspace lung opacity are slightly less prominent than they were on the earlier study.  The difference may be technical only.  IMPRESSION: Pneumomediastinum and subcutaneous air, stable from earlier exam. Tiny left apical pneumothorax, also stable.  No right pneumothorax is seen.  Airspace lung opacities appear subtly improved although the difference could be technical only.  No  other change.   Original Report Authenticated By: Amie Portland, M.D.   Dg Chest Port 1 View  03/05/2013   *RADIOLOGY REPORT*  Clinical Data: Pneumomediastinum  PORTABLE CHEST - 1 VIEW  Comparison: 03/04/2013  Findings: Pneumomediastinum and upper chest and neck base subcutaneous emphysema is stable.  There is a tiny left apical pneumothorax, which appears decreased from the previous day's exam.  The right apical pneumothorax is not evident on the current study.  Patchy bilateral perihilar airspace infiltrates are stable.  No new lung opacities.  IMPRESSION: Pneumomediastinum and subcutaneous emphysema is stable.  Tiny right pneumothorax not seen on the current exam.  Tiny left pneumothorax has decreased in size.  Persistent bilateral perihilar infiltrates.   Original Report Authenticated By: Amie Portland, M.D.   Dg Chest Port 1 View  03/04/2013   *RADIOLOGY REPORT*  Clinical Data: Follow up pneumothoraces and pneumomediastinum  PORTABLE CHEST - 1 VIEW  Comparison: 03/04/2013 at 1424 hours  Findings: Stable heart size mediastinal contours. Persistent pneumomediastinum. Persistent small biapical pneumothoraces, both minimally increased. Extensive perihilar infiltrates again seen. No gross pleural effusion or acute osseous findings.  IMPRESSION: Persistent pulmonary infiltrates. Persistent pneumomediastinum little changed. Very small biapical pneumothoraces, minimally increased in size bilaterally.   Original Report Authenticated By: Ulyses Southward, M.D.   Dg Chest Port 1 View  03/04/2013   *RADIOLOGY REPORT*  Clinical Data: Follow up pneumomediastinum.  Pneumonitis involving the lungs.  PORTABLE CHEST - 1 VIEW  Comparison: Two-view chest x-ray and CT chest yesterday.  Findings: No significant change to perhaps slight increase in the pneumomediastinum since yesterday.  Subcutaneous emphysema in both sides of the visualized neck, a new finding.  Interval development of a small (5% or so) left apical pneumothorax  and a very small (less than 5%) right apical pneumothorax. The patchy opacities throughout both lungs have become more confluent in the perihilar regions of the upper lobes.  IMPRESSION:  1.  Stable to slight increase in pneumomediastinum since yesterday. 2.  Interval development of a small left apical pneumothorax and a very small right apical pneumothorax. 3.  Interval development of subcutaneous emphysema in the soft tissues of the neck. 4.  Slight progression of pneumonia/pneumonitis in both lungs which has become more confluent in the perihilar regions of the upper lobes.  These results were called by telephone on 03/04/2013 at 1455 hours to Rosey Bath, the nurse in the medical ICU caring for the patient, who verbally acknowledged these results.   Original Report Authenticated By: Hulan Saas, M.D.     ASSESSMENT / PLAN: A: Acute hypoxic respiratory failure 2nd to pneumonia, pneumomediastinum, and RAD's. Small b/l PTX likely airtracking from pneumomediastinum >> improved 8/25. P: -f/u CXR -no  indication for chest tubes at this time >> TCTS following -oxygen to keep SpO2 > 92% -mobilize as tolerated -D4/x rocephin, zithromax -check respiratory viral panel -will need outpt asthma/allergy evaluation -change solumedrol to 20 mg q8h -change xopenex to q6h and q3h prn -continue bronchial hygiene, cough regimen -prn tylenol, oxycodone, dilaudid for chest pain  A: Sinus tachycardia 2nd to pneumonia, hypoxia, pneumomediastinum. P: -monitor on tele  Hypokalemia. P: -f/u BMET  Leukocytosis >> 2nd to pneumonia and steroids. P: -f/u CBC  Updated father at bedside. Transfer to SDU >> keep on PCCM service.  Coralyn Helling, MD [facility] Pulmonary/Critical Care 03/06/2013, 11:16 AM Pager:  [phone] After 3pm call: [phone]

## 2013-03-06 NOTE — Progress Notes (Signed)
Utilization review completed.  P.J. Jaymari Cromie,RN,BSN Case Manager [phone]  

## 2013-03-07 ENCOUNTER — Inpatient Hospital Stay (HOSPITAL_COMMUNITY): Payer: BC Managed Care – PPO

## 2013-03-07 DIAGNOSIS — R0902 Hypoxemia: Secondary | ICD-10-CM

## 2013-03-07 DIAGNOSIS — R509 Fever, unspecified: Secondary | ICD-10-CM

## 2013-03-07 DIAGNOSIS — E876 Hypokalemia: Secondary | ICD-10-CM

## 2013-03-07 LAB — BASIC METABOLIC PANEL
Calcium: 9 mg/dL (ref 8.4–10.5)
Creatinine, Ser: 0.52 mg/dL (ref 0.50–1.10)
GFR calc Af Amer: >90 mL/min (ref >90)
GFR calc non Af Amer: >90 mL/min (ref >90)
Sodium: 136 mEq/L (ref 135–145)

## 2013-03-07 LAB — CBC
Platelets: 244 10*3/uL (ref 150–400)
RDW: 13.6 % (ref 11.5–15.5)
WBC: 9.2 10*3/uL (ref 4.0–10.5)

## 2013-03-07 LAB — RESPIRATORY VIRUS PANEL
Adenovirus: NOT DETECTED
Influenza A H3: NOT DETECTED
Influenza A: NOT DETECTED
Influenza B: NOT DETECTED
Parainfluenza 1: NOT DETECTED
Parainfluenza 2: NOT DETECTED
Parainfluenza 3: NOT DETECTED

## 2013-03-07 MED ORDER — LORAZEPAM 2 MG/ML IJ SOLN
0.5000 mg | Freq: Once | INTRAMUSCULAR | Status: AC
  Administered 2013-03-07: 0.5 mg via INTRAVENOUS
  Filled 2013-03-07: qty 1

## 2013-03-07 MED ORDER — BISACODYL 5 MG PO TBEC
5.0000 mg | DELAYED_RELEASE_TABLET | Freq: Every day | ORAL | Status: DC | PRN
  Administered 2013-03-07: 5 mg via ORAL
  Filled 2013-03-07: qty 1

## 2013-03-07 MED ORDER — METHYLPREDNISOLONE SODIUM SUCC 40 MG IJ SOLR
20.0000 mg | Freq: Two times a day (BID) | INTRAMUSCULAR | Status: DC
  Administered 2013-03-07 – 2013-03-08 (×2): 20 mg via INTRAVENOUS
  Filled 2013-03-07 (×4): qty 0.5

## 2013-03-07 MED ORDER — LEVOFLOXACIN IN D5W 750 MG/150ML IV SOLN
750.0000 mg | INTRAVENOUS | Status: DC
  Administered 2013-03-07 – 2013-03-08 (×2): 750 mg via INTRAVENOUS
  Filled 2013-03-07 (×4): qty 150

## 2013-03-07 NOTE — Progress Notes (Signed)
eLink Physician-Brief Progress Note Patient Name: Valerie Castro DOB: [birth year] MRN: [id number]  Date of Service  03/07/2013   HPI/Events of Note  Call from nurse reporting increasing anxiety in patient with spontaneous PTX.  Sats are stable along with BP with RR of 23.  Currently crying at bedside/coughing - admits to feeling anxious.   eICU Interventions  Plan: One time dose of ativan 0.5 mg IV Continue to monitor patient   Intervention Category Minor Interventions: Agitation / anxiety - evaluation and management  Cruise Baumgardner 03/07/2013, 1:18 AM

## 2013-03-07 NOTE — Progress Notes (Signed)
Pt c/o of chest pain 7/10. Pain Medication given . Pt c/o of blood coming from nose when she blew into tissue. Pt has no continuous blood running from nose. Pt VS are stable. Family at bedside. Notified Dr.Deterding. No new orders received.

## 2013-03-07 NOTE — Progress Notes (Signed)
PULMONARY  / CRITICAL CARE MEDICINE  Name: Valerie Castro MRN: [id number] DOB: 29-Sep-1990    ADMISSION DATE:  03/03/2013 CONSULTATION DATE:  03/04/2013  REFERRING MD :  Dr. Rubin Payor PRIMARY SERVICE: Emergency Medicine  CHIEF COMPLAINT:  Chest pain or shortness of breath  BRIEF PATIENT DESCRIPTION: 22 year-old female with pneumomediastinum in the setting of bronchitis/pneumonia.  UDS positive for THC.  No prior hx of asthma.  SIGNIFICANT EVENTS: 8/24 TCTS consulted 8/25 Transfer to SDU 8/26- improved pcxr  STUDIES:  Chest CT 8/23 >> b/l ASD, pneumomediastinum.  LINES / TUBES None  CULTURES: Blood 8/22 >>  HIV 8/22 >> negative Urine legionella Ag 8/23 >> negative Urine pneumococcal Ag 8/23 >> negative Respiratory viral panel 8/25 >>   ANTIBIOTICS: Rocephin 8/22 >> Zithromax 8/22 >>  Subjective: pcxr improved, no distress  PHYSICAL EXAM VITAL SIGNS: Temp:  [98 F (36.7 C)-98.6 F (37 C)] 98.2 F (36.8 C) (08/26 0800) Pulse Rate:  [62-108] 108 (08/26 1000) Resp:  [21-35] 33 (08/26 1000) BP: (122-145)/(58-78) 128/61 mmHg (08/26 1202) SpO2:  [95 %-99 %] 97 % (08/26 1000) Weight:  [58 kg (127 lb 13.9 oz)] 58 kg (127 lb 13.9 oz) (08/26 0440) 4 liters [place]  INTAKE / OUTPUT: Intake/Output     08/25 0701 - 08/26 0700 08/26 0701 - 08/27 0700   P.O.     IV Piggyback  50   Total Intake(mL/kg)  50 (0.9)   Urine (mL/kg/hr) 450 (0.3) 950 (3.1)   Total Output 450 950   Net -450 -900        Urine Occurrence 3 x 1 x     PHYSICAL EXAMINATION: General: No distress Neuro: alert, normal strength HEENT: No sinus tenderness, no crepitus at all Cardiovascular: regular, tachycardic Lungs: b/l rhonchi and wheeze improved Abdomen:   soft, nontender Musculoskeletal: no edema Skin:   no rashes  LABS:  CBC Recent Labs     03/05/13  0410  03/06/13  0420  03/07/13  0541  WBC  17.5*  10.6*  9.2  HGB  12.3  11.5*  11.7*  HCT  35.4*  33.6*  33.6*  PLT  270  255  244    BMET Recent Labs     03/05/13  0410  03/06/13  0420  03/07/13  0541  NA  136  136  136  K  3.6  3.5  4.0  CL  104  101  101  CO2  22  24  25   BUN  10  13  15   CREATININE  0.49*  0.49*  0.52  GLUCOSE  135*  135*  122*    Electrolytes Recent Labs     03/05/13  0410  03/06/13  0420  03/07/13  0541  CALCIUM  9.1  8.8  9.0   Imaging Dg Chest Port 1 View  03/07/2013   *RADIOLOGY REPORT*  Clinical Data: Follow up pneumonia, pneumomediastinum  PORTABLE CHEST - 1 VIEW  Comparison: Prior chest x-ray 03/06/2013  Findings: Persistent but improving pneumomediastinum.  Slight interval improvement in scattered patchy interstitial and alveolar airspace opacities throughout both lungs.  No new focal airspace consolidation, pleural effusion or pneumothorax.  No acute osseous abnormality.  IMPRESSION:  1.  Persistent but improving pneumomediastinum. 2.  Slight interval improvement in scattered patchy bilateral areas of airspace disease.   Original Report Authenticated By: Malachy Moan, M.D.   Dg Chest Port 1 View  03/06/2013   *RADIOLOGY REPORT*  Clinical Data: Pneumomediastinum.  Pneumonitis.  Pneumothorax.  PORTABLE CHEST - 1 VIEW  Comparison: Multiple priors dating back to 03/04/2013.  Findings: Bilateral perihilar airspace disease remains present, unchanged.  The pneumothoraces seen on prior exams are no longer identified.  Pneumomediastinum remains visible.  There are no secondary signs of pneumothorax.  Soft tissue emphysema appears similar to prior. Monitoring leads are projected over the chest. Cardiopericardial silhouette appears similar.  IMPRESSION:  1.  Nonvisualization of the bilateral pneumothoraces. 2.  Unchanged pneumomediastinum. 3.  Bilateral perihilar airspace disease is unchanged.   Original Report Authenticated By: Andreas Newport, M.D.   Dg Chest Port 1 View  03/05/2013   *RADIOLOGY REPORT*  Clinical Data: Pneumomediastinum.  Tiny pneumothoraces.  Follow-up.  PORTABLE CHEST -  1 VIEW  Comparison: 03/05/2013 and 05/27 the  Findings: Tiny left apical pneumothorax is stable.  No right pneumothorax is seen.  Pneumomediastinum and subcutaneous air is unchanged.  Patchy areas of perihilar airspace lung opacity are slightly less prominent than they were on the earlier study.  The difference may be technical only.  IMPRESSION: Pneumomediastinum and subcutaneous air, stable from earlier exam. Tiny left apical pneumothorax, also stable.  No right pneumothorax is seen.  Airspace lung opacities appear subtly improved although the difference could be technical only.  No other change.   Original Report Authenticated By: Amie Portland, M.D.     ASSESSMENT / PLAN: A: Acute hypoxic respiratory failure 2nd to pneumonia, pneumomediastinum, and RAD's. Small b/l PTX likely airtracking from pneumomediastinum >> improved 8/25. P: -f/u CXR in am to full resolution air left med -oxygen to keep SpO2 > 92%, avoid hypoxia with air mediastinum -mobilize as tolerated -D4/x rocephin, zithromax, consider atypical coverage total 8 days, consider monotherapy quinolone -check respiratory viral panel - pending -will need outpt asthma/allergy evaluation -change solumedrol to 20 mg q12h- to pred in am likely -xopenex to q6h and q3h prn -continue bronchial hygiene, cough regimen -prn tylenol, oxycodone, dilaudid for chest pain  A: Sinus tachycardia 2nd to pneumonia, hypoxia, pneumomediastinum. P: -monitor on tele  Hypokalemia reoslved P: -f/u BMET intermittent  Leukocytosis >> 2nd to pneumonia and steroids. P: -Not eno EOS on diff on admission, may consider re eval pre dc of eos % -steroid reduction  constpipation - dulc supp  Updated father at bedside. keep on PCCM service.  Mcarthur Rossetti. Tyson Alias, MD, FACP Pgr: [phone] [facility]

## 2013-03-08 ENCOUNTER — Inpatient Hospital Stay (HOSPITAL_COMMUNITY): Payer: BC Managed Care – PPO

## 2013-03-08 MED ORDER — PREDNISONE 20 MG PO TABS
20.0000 mg | ORAL_TABLET | Freq: Two times a day (BID) | ORAL | Status: DC
  Administered 2013-03-08 – 2013-03-09 (×2): 20 mg via ORAL
  Filled 2013-03-08 (×6): qty 1

## 2013-03-08 MED ORDER — ZOLPIDEM TARTRATE 5 MG PO TABS: 5.0000 mg | ORAL_TABLET | Freq: Once | ORAL | Status: DC

## 2013-03-08 MED ORDER — DOCUSATE SODIUM 100 MG PO CAPS
100.0000 mg | ORAL_CAPSULE | Freq: Two times a day (BID) | ORAL | Status: DC
  Administered 2013-03-08 (×2): 100 mg via ORAL
  Filled 2013-03-08 (×4): qty 1

## 2013-03-08 NOTE — Progress Notes (Signed)
Pt transferred to  [address], bed 5.  Report called to Shanon Brow, RN. Pt being transported via wheel chair.  Transported by Orlie Pollen, Nurse Tech.

## 2013-03-08 NOTE — Progress Notes (Signed)
PULMONARY  / CRITICAL CARE MEDICINE  Name: Valerie Castro MRN: [id number] DOB: 06-11-1991    ADMISSION DATE:  03/03/2013 CONSULTATION DATE:  03/04/2013  REFERRING MD :  Dr. Rubin Payor PRIMARY SERVICE: Emergency Medicine  CHIEF COMPLAINT:  Chest pain or shortness of breath  BRIEF PATIENT DESCRIPTION: 22 year-old female with pneumomediastinum in the setting of bronchitis/pneumonia.  UDS positive for THC.  No prior hx of asthma.  SIGNIFICANT EVENTS: 8/24 TCTS consulted 8/25 Transfer to SDU 8/26- improved pcxr 8/27- off O2  STUDIES:  Chest CT 8/23 >> b/l ASD, pneumomediastinum.  LINES / TUBES None  CULTURES: Blood 8/22 >>  HIV 8/22 >> negative Urine legionella Ag 8/23 >> negative Urine pneumococcal Ag 8/23 >> negative Respiratory viral panel 8/25 >>   ANTIBIOTICS: Rocephin 8/22 >>8/26 Zithromax 8/22 >>8/26 Levofloxacin 8/26>>>  Subjective: Off O2, small BM day prior  PHYSICAL EXAM VITAL SIGNS: Temp:  [97.9 F (36.6 C)-98.6 F (37 C)] 98.1 F (36.7 C) (08/27 0749) Pulse Rate:  [80-103] 81 (08/27 0749) Resp:  [19-29] 29 (08/27 0749) BP: (127-134)/(61-76) 134/73 mmHg (08/27 0749) SpO2:  [96 %-100 %] 99 % (08/27 0749) Weight:  [55.3 kg (121 lb 14.6 oz)] 55.3 kg (121 lb 14.6 oz) (08/27 0416) 4 lit[address]  INTAKE / OUTPUT: Intake/Output     08/26 0701 - 08/27 0700 08/27 0701 - 08/28 0700   IV Piggyback 200    Total Intake(mL/kg) 200 (3.6)    Urine (mL/kg/hr) 950 (0.7)    Total Output 950     Net -750          Urine Occurrence 4 x 1 x     PHYSICAL EXAMINATION: General: No distress Neuro: alert, normal strength HEENT: no crepitus Cardiovascular: s1 s2 RRR Lungs: bilateral wheeze improved further Abdomen:   soft, nontender Musculoskeletal: no edema Skin:   no rashes  LABS:  CBC Recent Labs     03/06/13  0420  03/07/13  0541  WBC  10.6*  9.2  HGB  11.5*  11.7*  HCT  33.6*  33.6*  PLT  255  244   BMET Recent Labs     03/06/13  0420  03/07/13  0541   NA  136  136  K  3.5  4.0  CL  101  101  CO2  24  25  BUN  13  15  CREATININE  0.49*  0.52  GLUCOSE  135*  122*    Electrolytes Recent Labs     03/06/13  0420  03/07/13  0541  CALCIUM  8.8  9.0   Imaging Dg Chest Port 1 View  03/08/2013   *RADIOLOGY REPORT*  Clinical Data: Shortness of breath.  Evaluate for "infiltrates."  PORTABLE CHEST - 1 VIEW  Comparison: 1 day prior  Findings: Midline trachea.  Normal heart size.  Further improvement to resolution of pneumomediastinum.  The left heart border is well visualized in the region of the transverse aorta, suggesting minimal residual mediastinal air. No pleural effusion or pneumothorax.  Mild right hemidiaphragm elevation.  Resolution of bilateral airspace opacities.  IMPRESSION: Near complete resolution of pneumomediastinum.  Resolved bilateral airspace disease.   Original Report Authenticated By: Jeronimo Greaves, M.D.   Dg Chest Port 1 View  03/07/2013   *RADIOLOGY REPORT*  Clinical Data: Follow up pneumonia, pneumomediastinum  PORTABLE CHEST - 1 VIEW  Comparison: Prior chest x-ray 03/06/2013  Findings: Persistent but improving pneumomediastinum.  Slight interval improvement in scattered patchy interstitial and alveolar airspace opacities throughout both  lungs.  No new focal airspace consolidation, pleural effusion or pneumothorax.  No acute osseous abnormality.  IMPRESSION:  1.  Persistent but improving pneumomediastinum. 2.  Slight interval improvement in scattered patchy bilateral areas of airspace disease.   Original Report Authenticated By: Malachy Moan, M.D.     ASSESSMENT / PLAN: A: Acute hypoxic respiratory failure 2nd to pneumontis (rhino virus POS), pneumomediastinum, and RAD's. Small b/l PTX likely airtracking from pneumomediastinum >> about resolved P: -f/u CXR resolving, no sig air noted -now off O2, ambulate and check sats -mobilize as tolerated, increase this - monotherapy quinolone, consider 8 days -check respiratory  viral panel - POS Rhinoviris -will need outpt asthma/allergy evaluation- pft upon dc and full resolution -change solumedrol to prednisone -xopenex to q6h and q3h prn -continue bronchial hygiene, cough regimen -prn tylenol, oxycodone, dilaudid for chest pain, dc as not needed  A: Sinus tachycardia 2nd to pneumonia, hypoxia, pneumomediastinum. P: -tele dc   constpipation - dulc supp, repeat  -add colace  keep on PCCM service. To floor Likely dc in am  Ambulation pulse ox will decide  Mcarthur Rossetti. Tyson Alias, MD, FACP Pgr: [phone] [facility]

## 2013-03-09 MED ORDER — LEVOFLOXACIN 750 MG PO TABS: 750.0000 mg | ORAL_TABLET | ORAL | Status: DC

## 2013-03-09 MED ORDER — BENZONATATE 200 MG PO CAPS: 200.0000 mg | ORAL_CAPSULE | Freq: Three times a day (TID) | ORAL | Status: DC

## 2013-03-09 MED ORDER — PREDNISONE 10 MG PO TABS: ORAL_TABLET | ORAL | Status: DC

## 2013-03-09 MED ORDER — NONFORMULARY OR COMPOUNDED ITEM: Status: DC

## 2013-03-09 NOTE — Progress Notes (Signed)
Discharge home. Home discharge instruction given, no questions verbalized. 

## 2013-03-09 NOTE — Discharge Summary (Signed)
Physician Discharge Summary     Patient ID: Valerie Castro MRN: [id number] DOB/AGE: May 03, 1991 22 y.o.  Admit date: 03/03/2013 Discharge date: 03/09/2013  Discharge Diagnoses:  Acute hypoxic Respiratory failure (resolved) Viral Pneumonitis Reactive airway disease Pneumomediastinum with small bilateral pneumothorax  Sinus tachycardia (resolved)  Detailed Hospital Course:   22 year old female with no significant past medical history who presented to [facility] the pm hours of 8/23 with chest pain and shortness of breath. The chest pain and shortness of breath began on 8/20 when she developed an "cold" with associated rhinorrhea, sore throat, cough, and wheezing. Her boyfriend was recently sick as well. She presented to her primary care physician who diagnosed her with bronchitis and started her with antibiotics and prednisone. The evening of admit, the pain got worse and she decided to present to the emergency room.   Dx eval in ER showed: 1. Pneumomediastinum. 2. Bilateral interstitial and alveolar pneumonitis/pneumonia. She was admitted to the intensive care for further eval and treatment. Diagnostics and interventions were as follows: thoracic surgery eval: felt no Chest tube indicated. Empiric antibiotics, systemic steroids, scheduled bronchodilators, and serial CXRs. Diagnostics were sent and are listed as below in sig tests section. Of note her respiratory viral panel was positive for RHINOVIRUS.   With time her CXR demonstrated resolving pneumomediastinum, her pain improved and she was able to wean off oxygen. She was medically ready for d/c as of 8/28 and will be discharged to home w/ plans for follow up in our office for CXR, pred taper and a prescription to complete her empiric antibiotic course.     Discharge Plan by diagnoses  Pneumomediastinum with small bilateral pneumothorax  Plan: Has f/u appointment CXR in our office to look for resolution   Viral Pneumonitis   Plan Complete steroid taper Will go home on monotherapy Levaquin (8d total) to cover for possibility of bacterial superinfection   Presumed Reactive airway disease Plan Have instructed her on importance of smoking cessation  Will complete pred taper Will resume PRN SABA  Will need PFTs after resolution of current issues. We will follow her in office Will defer further allergy eval until we see her in out-pt setting.     Significant Hospital tests/ studies/ interventions and procedures  STUDIES:  Chest CT 8/23 >> b/l ASD, pneumomediastinum.   CULTURES:  Blood 8/22 >>  HIV 8/22 >> negative  Urine legionella Ag 8/23 >> negative  Urine pneumococcal Ag 8/23 >> negative  Respiratory viral panel 8/25 >> + rhinovirus   ANTIBIOTICS:  Rocephin 8/22 >>8/26  Zithromax 8/22 >>8/26  Levofloxacin 8/26>>>  Consults Thoracic surgery   Discharge Exam: BP 114/61  Pulse 66  Temp(Src) 98.4 F (36.9 C) (Oral)  Resp 18  Ht 5\' 4"  (1.626 m)  Wt 52.39 kg (115 lb 8 oz)  BMI 19.82 kg/m2  SpO2 96%  LMP 02/19/2013 Room air  PHYSICAL EXAMINATION:  General: No distress  Neuro: alert, normal strength  HEENT: no crepitus  Cardiovascular: s1 s2 RRR  Lungs: clear  Abdomen: soft, nontender  Musculoskeletal: no edema  Skin: no rashes   Labs at discharge Lab Results  Component Value Date   CREATININE 0.52 03/07/2013   BUN 15 03/07/2013   NA 136 03/07/2013   K 4.0 03/07/2013   CL 101 03/07/2013   CO2 25 03/07/2013   Lab Results  Component Value Date   WBC 9.2 03/07/2013   HGB 11.7* 03/07/2013   HCT 33.6* 03/07/2013   MCV 86.6 03/07/2013  PLT 244 03/07/2013   No results found for this basename: ALT,  AST,  GGT,  ALKPHOS,  BILITOT   No results found for this basename: INR,  PROTIME    Current radiology studies Dg Chest [facility] 1 View  03/08/2013   *RADIOLOGY REPORT*  Clinical Data: Shortness of breath.  Evaluate for "infiltrates."  PORTABLE CHEST - 1 VIEW  Comparison: 1 day prior   Findings: Midline trachea.  Normal heart size.  Further improvement to resolution of pneumomediastinum.  The left heart border is well visualized in the region of the transverse aorta, suggesting minimal residual mediastinal air. No pleural effusion or pneumothorax.  Mild right hemidiaphragm elevation.  Resolution of bilateral airspace opacities.  IMPRESSION: Near complete resolution of pneumomediastinum.  Resolved bilateral airspace disease.   Original Report Authenticated By: Jeronimo Greaves, M.D.    Disposition:  Final discharge disposition not confirmed      Discharge Orders   Future Appointments Provider Department Dept Phone   03/14/2013 3:00 PM Julio Sicks, NP [facility] [phone]   04/25/2013 2:00 PM Coralyn Helling, MD [facility] [phone]   05/17/2013 1:00 PM Verner Chol, CNM [facility] [phone]   Future Orders Complete By Expires   Diet - low sodium heart healthy  As directed    Discharge instructions  As directed    Comments:     No smoking No lifting more than 5 lbs for next 2 weeks.   Increase activity slowly  As directed        Medication List    STOP taking these medications       predniSONE 10 MG tablet  Commonly known as:  STERAPRED UNI-PAK  Replaced by:  predniSONE 10 MG tablet     ZITHROMAX Z-PAK 250 MG tablet  Generic drug:  azithromycin      TAKE these medications       acetaminophen 500 MG tablet  Commonly known as:  TYLENOL  Take 500 mg by mouth every 6 (six) hours as needed for pain.     albuterol 108 (90 BASE) MCG/ACT inhaler  Commonly known as:  PROVENTIL HFA;VENTOLIN HFA  Inhale 2 puffs into the lungs every 4 (four) hours as needed for wheezing.     benzonatate 200 MG capsule  Commonly known as:  TESSALON  Take 1 capsule (200 mg total) by mouth 3 (three) times daily.     etonogestrel-ethinyl estradiol 0.12-0.015 MG/24HR vaginal ring  Commonly known as:  NUVARING  Place 1 each vaginally  every 28 (twenty-eight) days. Insert vaginally and leave in place for 3 consecutive weeks, then remove for 1 week.     ibuprofen 200 MG tablet  Commonly known as:  ADVIL,MOTRIN  Take 400 mg by mouth every 6 (six) hours as needed for pain.     levofloxacin 750 MG tablet  Commonly known as:  LEVAQUIN  Take 1 tablet (750 mg total) by mouth daily.     NONFORMULARY OR COMPOUNDED ITEM  No lifting More than 5 lbs for next 2 weeks.     predniSONE 10 MG tablet  Commonly known as:  DELTASONE  Take 4 tabs  daily with food x 3 days, then 3 tabs daily x 3 days, then 2 tabs daily x 3 days, then 1 tab daily x3 days then stop. 30       Follow-up Information   Follow up with PARRETT,TAMMY, NP On 03/14/2013. (230 for check in and CXR and 3pm with Tammy)  Specialty:  Nurse Practitioner   Contact information:   [address] [place] [address] [phone]       Follow up with Coralyn Helling, MD On 04/25/2013. (2pm )    Specialty:  Pulmonary Disease   Contact information:   [address] [place] [address] [phone]       Discharged Condition: good  Physician Statement:   The Patient was personally examined, the discharge assessment and plan has been personally reviewed and I agree with ACNP Babcock's assessment and plan. > 30 minutes of time have been dedicated to discharge assessment, planning and discharge instructions.   SignedAnders Simmonds 03/09/2013, 9:26 AM   Agree with above  Billy Fischer, MD ; [facility] service Mobile [phone].  After 5:30 PM or weekends, call [phone]

## 2013-03-09 NOTE — Progress Notes (Signed)
PHARMACIST - PHYSICIAN COMMUNICATION DR:  Tyson Alias and Colleagues CONCERNING: Antibiotic IV to Oral Route Change Policy  RECOMMENDATION: This patient is receiving Levaquin by the intravenous route.  Based on criteria approved by the Pharmacy and Therapeutics Committee, the antibiotic(s) is/are being converted to the equivalent oral dose form(s).   DESCRIPTION: These criteria include:  Patient being treated for a respiratory tract infection, urinary tract infection, cellulitis or clostridium difficile associated diarrhea if on metronidazole  The patient is not neutropenic and does not exhibit a GI malabsorption state  The patient is eating (either orally or via tube) and/or has been taking other orally administered medications for a least 24 hours  The patient is improving clinically and has a Tmax < 100.5  If you have questions about this conversion, please contact the Pharmacy Department  []   [phone] )  [facility] [x]   [phone] )  [facility]  []   [phone] )  [facility] []   [phone] )  [facility]    Thank you, Christoper Fabian, PharmD, BCPS Clinical pharmacist, pager [phone] 03/09/2013 8:21 AM

## 2013-03-10 LAB — CULTURE, BLOOD (ROUTINE X 2): Culture: NO GROWTH

## 2013-03-14 ENCOUNTER — Ambulatory Visit (INDEPENDENT_AMBULATORY_CARE_PROVIDER_SITE_OTHER)
Admission: RE | Admit: 2013-03-14 | Discharge: 2013-03-14 | Disposition: A | Discharge status: Home / Self Care | Payer: BC Managed Care – PPO | Source: Ambulatory Visit | Attending: Adult Health | Admitting: Adult Health

## 2013-03-14 ENCOUNTER — Encounter: Payer: Self-pay | Admitting: Adult Health

## 2013-03-14 ENCOUNTER — Ambulatory Visit (INDEPENDENT_AMBULATORY_CARE_PROVIDER_SITE_OTHER): Payer: BC Managed Care – PPO | Admitting: Adult Health

## 2013-03-14 VITALS — BP 108/64 | HR 75 | Temp 97.4°F | Ht 64.0 in | Wt 120.6 lb

## 2013-03-14 DIAGNOSIS — J189 Pneumonia, unspecified organism: Secondary | ICD-10-CM

## 2013-03-14 DIAGNOSIS — J982 Interstitial emphysema: Secondary | ICD-10-CM

## 2013-03-14 DIAGNOSIS — J939 Pneumothorax, unspecified: Secondary | ICD-10-CM

## 2013-03-14 DIAGNOSIS — J9383 Other pneumothorax: Secondary | ICD-10-CM

## 2013-03-14 NOTE — Assessment & Plan Note (Signed)
Viral Pneumonitis vs PNA  Now resolving.  cxr is clear  Has finished abx .   Plan  Finish Prednisone taper as directed.  Flu shot today  No smoking  follow up Dr. Sherene Sires  In 3 weeks with PFT and As needed   Flu shot today

## 2013-03-14 NOTE — Assessment & Plan Note (Signed)
IN setting of acute bronchitis  CXR is clear and pneumomediastinum is resolved.   Return for PFT

## 2013-03-14 NOTE — Progress Notes (Signed)
  Subjective:    Patient ID: Valerie Castro, female    DOB: July 28, 1990, 22 y.o.   MRN: [id number]  HPI 22 year old female smoker, seen for initial pulmonary consult during hospital admission. 03/03/2013 for pneumomediastinum, bilateral interstitial and alveolar pneumonitis  03/14/2013 Post hospital follow up  22 year old female with no significant past medical history who presented 03/04/13  with chest pain and shortness of breath. CT chest showed Extensive pneumomediastinum without pneumothorax. This extends into the lower neck. Patchy interstitial and alveolar opacities an both lungs, involving all lobes, most pronounced in the upper lobes and perihilar regions UDS positive for THC . Resp Viral panel positive rhinovirus . Tx with IV abx and steroids  Recommended for asthma /allergy OP evaluation.  CXR shows No residual pneumomediastinum. Clear lungs.   Since discharge she is feeling much better . No chest pain or dyspnea.  Has stopped smoking. Encouraged on smoking cessation  Has few days left on pred taper.       Review of Systems Constitutional:   No  weight loss, night sweats,  Fevers, chills, fatigue, or  lassitude.  HEENT:   No headaches,  Difficulty swallowing,  Tooth/dental problems, or  Sore throat,                No sneezing, itching, ear ache, nasal congestion, post nasal drip,   CV:  No chest pain,  Orthopnea, PND, swelling in lower extremities, anasarca, dizziness, palpitations, syncope.   GI  No heartburn, indigestion, abdominal pain, nausea, vomiting, diarrhea, change in bowel habits, loss of appetite, bloody stools.   Resp: No shortness of breath with exertion or at rest.  No excess mucus, no productive cough,  No non-productive cough,  No coughing up of blood.  No change in color of mucus.  No wheezing.  No chest wall deformity  Skin: no rash or lesions.  GU: no dysuria, change in color of urine, no urgency or frequency.  No flank pain, no hematuria   MS:  No joint pain  or swelling.  No decreased range of motion.  No back pain.  Psych:  No change in mood or affect. No depression or anxiety.  No memory loss.         Objective:   Physical Exam GEN: A/Ox3; pleasant , NAD, well nourished   HEENT:  [place]/AT,  EACs-clear, TMs-wnl, NOSE-clear, THROAT-clear, no lesions, no postnasal drip or exudate noted.   NECK:  Supple w/ fair ROM; no JVD; normal carotid impulses w/o bruits; no thyromegaly or nodules palpated; no lymphadenopathy.  RESP  Clear  P & A; w/o, wheezes/ rales/ or rhonchi.no accessory muscle use, no dullness to percussion  CARD:  RRR, no m/r/g  , no peripheral edema, pulses intact, no cyanosis or clubbing.  GI:   Soft & nt; nml bowel sounds; no organomegaly or masses detected.  Musco: Warm bil, no deformities or joint swelling noted.   Neuro: alert, no focal deficits noted.    Skin: Warm, no lesions or rashes   CXR 03/14/2013 >No residual pneumomediastinum. Clear lungs. No acute cardiopulmonary abnormality.         Assessment & Plan:

## 2013-03-14 NOTE — Patient Instructions (Addendum)
Finish Prednisone taper as directed.  Flu shot today  No smoking  follow up Dr. Craige Cotta with PFT in 6 weeks and As needed   Flu shot today

## 2013-03-14 NOTE — Progress Notes (Signed)
Reviewed and agree with assessment/plan. 

## 2013-03-31 ENCOUNTER — Telehealth: Payer: Self-pay | Admitting: Pulmonary Disease

## 2013-04-03 NOTE — Telephone Encounter (Signed)
Per OV note : Resp Viral panel positive rhinovirus .  Pt mother advised. Carron Curie, CMA

## 2013-04-25 ENCOUNTER — Institutional Professional Consult (permissible substitution): Payer: BC Managed Care – PPO | Admitting: Pulmonary Disease

## 2013-05-17 ENCOUNTER — Ambulatory Visit: Payer: Self-pay | Admitting: Certified Nurse Midwife

## 2013-05-25 ENCOUNTER — Ambulatory Visit (INDEPENDENT_AMBULATORY_CARE_PROVIDER_SITE_OTHER): Payer: BC Managed Care – PPO | Admitting: Pulmonary Disease

## 2013-05-25 ENCOUNTER — Encounter: Payer: Self-pay | Admitting: Pulmonary Disease

## 2013-05-25 ENCOUNTER — Encounter (INDEPENDENT_AMBULATORY_CARE_PROVIDER_SITE_OTHER): Payer: Self-pay

## 2013-05-25 VITALS — BP 108/68 | HR 92 | Ht 63.0 in | Wt 116.0 lb

## 2013-05-25 DIAGNOSIS — F17200 Nicotine dependence, unspecified, uncomplicated: Secondary | ICD-10-CM

## 2013-05-25 DIAGNOSIS — J189 Pneumonia, unspecified organism: Secondary | ICD-10-CM

## 2013-05-25 DIAGNOSIS — Z720 Tobacco use: Secondary | ICD-10-CM

## 2013-05-25 DIAGNOSIS — J982 Interstitial emphysema: Secondary | ICD-10-CM

## 2013-05-25 DIAGNOSIS — J9600 Acute respiratory failure, unspecified whether with hypoxia or hypercapnia: Secondary | ICD-10-CM

## 2013-05-25 LAB — PULMONARY FUNCTION TEST

## 2013-05-25 NOTE — Patient Instructions (Signed)
Follow up as needed

## 2013-05-25 NOTE — Assessment & Plan Note (Signed)
PFT's are normal.  She is not needing inhaler therapy.  Discussed options to help her stop smoking >> she will think about these.

## 2013-05-25 NOTE — Assessment & Plan Note (Signed)
Related to viral pneumonia.  Resolved.

## 2013-05-25 NOTE — Assessment & Plan Note (Signed)
Resolved

## 2013-05-25 NOTE — Progress Notes (Signed)
Chief Complaint  Patient presents with  . Pulmonary Consult    Breathing has been doing okay. Had PFT today.    History of Present Illness: Valerie Castro is a 22 y.o. female smoker for f/u of viral pneumonia complicated by pneumomediastinum.  She continues to smoke.  She is not sure she is ready to quit.  She denies cough, wheeze, sputum chest pain, or dyspnea.  She is not using any inhaler medicines.   TESTS: PFT 05/25/13 >> FEV1 2.93 (91%), FEV1% 91, TLC 4.12 (84%), DLCO 85%, no BD  Valerie Castro  has a past medical history of Anxiety; Viral pneumonia (August 2014); and Pneumomediastinum (August 2014).  Valerie Castro  has no past surgical history on file.  Prior to Admission medications   Medication Sig Start Date End Date Taking? Authorizing Provider  acetaminophen (TYLENOL) 500 MG tablet Take 500 mg by mouth every 6 (six) hours as needed for pain.   Yes Historical Provider, MD  albuterol (PROVENTIL HFA;VENTOLIN HFA) 108 (90 BASE) MCG/ACT inhaler Inhale 2 puffs into the lungs every 4 (four) hours as needed for wheezing.   Yes Historical Provider, MD  etonogestrel-ethinyl estradiol (NUVARING) 0.12-0.015 MG/24HR vaginal ring Place 1 each vaginally every 28 (twenty-eight) days. Insert vaginally and leave in place for 3 consecutive weeks, then remove for 1 week.   Yes Historical Provider, MD  ibuprofen (ADVIL,MOTRIN) 200 MG tablet Take 400 mg by mouth every 6 (six) hours as needed for pain.   Yes Historical Provider, MD    Allergies  Allergen Reactions  . Sulfa Antibiotics Itching    Review of Systems  Constitutional: Negative for fever, chills, diaphoresis, activity change, appetite change, fatigue and unexpected weight change.  HENT: Negative for congestion, dental problem, ear discharge, ear pain, facial swelling, hearing loss, mouth sores, nosebleeds, postnasal drip, rhinorrhea, sinus pressure, sneezing, sore throat, tinnitus, trouble swallowing and voice change.   Eyes: Negative for  photophobia, discharge, itching and visual disturbance.  Respiratory: Positive for shortness of breath. Negative for apnea, cough, choking, chest tightness, wheezing and stridor.   Cardiovascular: Negative for chest pain, palpitations and leg swelling.  Gastrointestinal: Negative for nausea, vomiting, abdominal pain, constipation, blood in stool and abdominal distention.  Genitourinary: Negative for dysuria, urgency, frequency, hematuria, flank pain, decreased urine volume and difficulty urinating.  Musculoskeletal: Negative for arthralgias, back pain, gait problem, joint swelling, myalgias, neck pain and neck stiffness.  Skin: Negative for color change, pallor and rash.  Neurological: Negative for dizziness, tremors, seizures, syncope, speech difficulty, weakness, light-headedness, numbness and headaches.  Hematological: Negative for adenopathy. Does not bruise/bleed easily.  Psychiatric/Behavioral: Positive for dysphoric mood. Negative for confusion, sleep disturbance and agitation. The patient is nervous/anxious.    Physical Exam:  General - No distress ENT - No sinus tenderness, no oral exudate, no LAN Cardiac - s1s2 regular, no murmur Chest - No wheeze/rales/dullness Back - No focal tenderness Abd - Soft, non-tender Ext - No edema Neuro - Normal strength Skin - No rashes Psych - normal mood, and behavior   Assessment/Plan:  Coralyn Helling, MD [facility] Pager:  [phone]

## 2013-05-25 NOTE — Progress Notes (Signed)
PFT done today. 

## 2013-05-25 NOTE — Progress Notes (Deleted)
  Subjective:    Patient ID: Valerie Castro, female    DOB: October 12, 1990, 22 y.o.   MRN: [id number]  HPI    Review of Systems  Constitutional: Negative for fever, chills, diaphoresis, activity change, appetite change, fatigue and unexpected weight change.  HENT: Negative for congestion, dental problem, ear discharge, ear pain, facial swelling, hearing loss, mouth sores, nosebleeds, postnasal drip, rhinorrhea, sinus pressure, sneezing, sore throat, tinnitus, trouble swallowing and voice change.   Eyes: Negative for photophobia, discharge, itching and visual disturbance.  Respiratory: Positive for shortness of breath. Negative for apnea, cough, choking, chest tightness, wheezing and stridor.   Cardiovascular: Negative for chest pain, palpitations and leg swelling.  Gastrointestinal: Negative for nausea, vomiting, abdominal pain, constipation, blood in stool and abdominal distention.  Genitourinary: Negative for dysuria, urgency, frequency, hematuria, flank pain, decreased urine volume and difficulty urinating.  Musculoskeletal: Negative for arthralgias, back pain, gait problem, joint swelling, myalgias, neck pain and neck stiffness.  Skin: Negative for color change, pallor and rash.  Neurological: Negative for dizziness, tremors, seizures, syncope, speech difficulty, weakness, light-headedness, numbness and headaches.  Hematological: Negative for adenopathy. Does not bruise/bleed easily.  Psychiatric/Behavioral: Positive for dysphoric mood. Negative for confusion, sleep disturbance and agitation. The patient is nervous/anxious.        Objective:   Physical Exam        Assessment & Plan:

## 2013-05-31 ENCOUNTER — Ambulatory Visit: Payer: Self-pay | Admitting: Certified Nurse Midwife

## 2013-06-16 ENCOUNTER — Encounter: Payer: Self-pay | Admitting: Pulmonary Disease

## 2013-06-21 ENCOUNTER — Encounter: Payer: Self-pay | Admitting: *Deleted

## 2013-07-25 ENCOUNTER — Encounter: Payer: Self-pay | Admitting: Certified Nurse Midwife

## 2013-07-25 ENCOUNTER — Ambulatory Visit: Payer: Self-pay | Admitting: Certified Nurse Midwife

## 2013-07-25 ENCOUNTER — Ambulatory Visit (INDEPENDENT_AMBULATORY_CARE_PROVIDER_SITE_OTHER): Payer: BC Managed Care – PPO | Admitting: Certified Nurse Midwife

## 2013-07-25 VITALS — BP 118/71 | HR 68 | Resp 16 | Ht 63.5 in | Wt 117.0 lb

## 2013-07-25 DIAGNOSIS — N390 Urinary tract infection, site not specified: Secondary | ICD-10-CM

## 2013-07-25 DIAGNOSIS — Z01419 Encounter for gynecological examination (general) (routine) without abnormal findings: Secondary | ICD-10-CM

## 2013-07-25 DIAGNOSIS — Z309 Encounter for contraceptive management, unspecified: Secondary | ICD-10-CM

## 2013-07-25 DIAGNOSIS — Z0000 Encounter for general adult medical examination without abnormal findings: Secondary | ICD-10-CM

## 2013-07-25 LAB — POCT URINALYSIS DIPSTICK
BILIRUBIN UA: NEGATIVE
Glucose, UA: NEGATIVE
KETONES UA: NEGATIVE
Nitrite, UA: NEGATIVE
PH UA: 5
PROTEIN UA: NEGATIVE
Urobilinogen, UA: NEGATIVE

## 2013-07-25 MED ORDER — ETONOGESTREL-ETHINYL ESTRADIOL 0.12-0.015 MG/24HR VA RING: VAGINAL_RING | VAGINAL | Status: DC

## 2013-07-25 NOTE — Patient Instructions (Signed)
General topics  Next pap or exam is  due in 1 year Take a Women's multivitamin Take 1200 mg. of calcium daily - prefer dietary If any concerns in interim to call back  Breast Self-Awareness Practicing breast self-awareness may pick up problems early, prevent significant medical complications, and possibly save your life. By practicing breast self-awareness, you can become familiar with how your breasts look and feel and if your breasts are changing. This allows you to notice changes early. It can also offer you some reassurance that your breast health is good. One way to learn what is normal for your breasts and whether your breasts are changing is to do a breast self-exam. If you find a lump or something that was not present in the past, it is best to contact your caregiver right away. Other findings that should be evaluated by your caregiver include nipple discharge, especially if it is bloody; skin changes or reddening; areas where the skin seems to be pulled in (retracted); or new lumps and bumps. Breast pain is seldom associated with cancer (malignancy), but should also be evaluated by a caregiver. BREAST SELF-EXAM The best time to examine your breasts is 5 7 days after your menstrual period is over.  ExitCare Patient Information 2013 [employer].   Exercise to Stay Healthy Exercise helps you become and stay healthy. EXERCISE IDEAS AND TIPS Choose exercises that:  You enjoy.  Fit into your day. You do not need to exercise really hard to be healthy. You can do exercises at a slow or medium level and stay healthy. You can:  Stretch before and after working out.  Try yoga, Pilates, or tai chi.  Lift weights.  Walk fast, swim, jog, run, climb stairs, bicycle, dance, or rollerskate.  Take aerobic classes. Exercises that burn about 150 calories:  Running 1  miles in 15 minutes.  Playing volleyball for 45 to 60 minutes.  Washing and waxing a car for 45 to 60  minutes.  Playing touch football for 45 minutes.  Walking 1  miles in 35 minutes.  Pushing a stroller 1  miles in 30 minutes.  Playing basketball for 30 minutes.  Raking leaves for 30 minutes.  Bicycling 5 miles in 30 minutes.  Walking 2 miles in 30 minutes.  Dancing for 30 minutes.  Shoveling snow for 15 minutes.  Swimming laps for 20 minutes.  Walking up stairs for 15 minutes.  Bicycling 4 miles in 15 minutes.  Gardening for 30 to 45 minutes.  Jumping rope for 15 minutes.  Washing windows or floors for 45 to 60 minutes. Document Released: 08/01/2010 Document Revised: 09/21/2011 Document Reviewed: 08/01/2010 ExitCare Patient Information 2013 [employer].   Other topics ( that may be useful information):    Sexually Transmitted Disease Sexually transmitted disease (STD) refers to any infection that is passed from person to person during sexual activity. This may happen by way of saliva, semen, blood, vaginal mucus, or urine. Common STDs include:  Gonorrhea.  Chlamydia.  Syphilis.  HIV/AIDS.  Genital herpes.  Hepatitis B and C.  Trichomonas.  Human papillomavirus (HPV).  Pubic lice. CAUSES  An STD may be spread by bacteria, virus, or parasite. A person can get an STD by:  Sexual intercourse with an infected person.  Sharing sex toys with an infected person.  Sharing needles with an infected person.  Having intimate contact with the genitals, mouth, or rectal areas of an infected person. SYMPTOMS  Some people may not have any symptoms, but   they can still pass the infection to others. Different STDs have different symptoms. Symptoms include:  Painful or bloody urination.  Pain in the pelvis, abdomen, vagina, anus, throat, or eyes.  Skin rash, itching, irritation, growths, or sores (lesions). These usually occur in the genital or anal area.  Abnormal vaginal discharge.  Penile discharge in men.  Soft, flesh-colored skin growths in the  genital or anal area.  Fever.  Pain or bleeding during sexual intercourse.  Swollen glands in the groin area.  Yellow skin and eyes (jaundice). This is seen with hepatitis. DIAGNOSIS  To make a diagnosis, your caregiver may:  Take a medical history.  Perform a physical exam.  Take a specimen (culture) to be examined.  Examine a sample of discharge under a microscope.  Perform blood test TREATMENT   Chlamydia, gonorrhea, trichomonas, and syphilis can be cured with antibiotic medicine.  Genital herpes, hepatitis, and HIV can be treated, but not cured, with prescribed medicines. The medicines will lessen the symptoms.  Genital warts from HPV can be treated with medicine or by freezing, burning (electrocautery), or surgery. Warts may come back.  HPV is a virus and cannot be cured with medicine or surgery.However, abnormal areas may be followed very closely by your caregiver and may be removed from the cervix, vagina, or vulva through office procedures or surgery. If your diagnosis is confirmed, your recent sexual partners need treatment. This is true even if they are symptom-free or have a negative culture or evaluation. They should not have sex until their caregiver says it is okay. HOME CARE INSTRUCTIONS  All sexual partners should be informed, tested, and treated for all STDs.  Take your antibiotics as directed. Finish them even if you start to feel better.  Only take over-the-counter or prescription medicines for pain, discomfort, or fever as directed by your caregiver.  Rest.  Eat a balanced diet and drink enough fluids to keep your urine clear or pale yellow.  Do not have sex until treatment is completed and you have followed up with your caregiver. STDs should be checked after treatment.  Keep all follow-up appointments, Pap tests, and blood tests as directed by your caregiver.  Only use latex condoms and water-soluble lubricants during sexual activity. Do not use  petroleum jelly or oils.  Avoid alcohol and illegal drugs.  Get vaccinated for HPV and hepatitis. If you have not received these vaccines in the past, talk to your caregiver about whether one or both might be right for you.  Avoid risky sex practices that can break the skin. The only way to avoid getting an STD is to avoid all sexual activity.Latex condoms and dental dams (for oral sex) will help lessen the risk of getting an STD, but will not completely eliminate the risk. SEEK MEDICAL CARE IF:   You have a fever.  You have any new or worsening symptoms. Document Released: 09/19/2002 Document Revised: 09/21/2011 Document Reviewed: 09/26/2010 [facility] Patient Information 2013 [employer].    Domestic Abuse You are being battered or abused if someone close to you hits, pushes, or physically hurts you in any way. You also are being abused if you are forced into activities. You are being sexually abused if you are forced to have sexual contact of any kind. You are being emotionally abused if you are made to feel worthless or if you are constantly threatened. It is important to remember that help is available. No one has the right to abuse you. PREVENTION OF FURTHER  ABUSE  Learn the warning signs of danger. This varies with situations but may include: the use of alcohol, threats, isolation from friends and family, or forced sexual contact. Leave if you feel that violence is going to occur.  If you are attacked or beaten, report it to the police so the abuse is documented. You do not have to press charges. The police can protect you while you or the attackers are leaving. Get the officer's name and badge number and a copy of the report.  Find someone you can trust and tell them what is happening to you: your caregiver, a nurse, clergy member, close friend or family member. Feeling ashamed is natural, but remember that you have done nothing wrong. No one deserves abuse. Document Released:  06/26/2000 Document Revised: 09/21/2011 Document Reviewed: 09/04/2010 ExitCare Patient Information 2013 [employer].    How Much is Too Much Alcohol? Drinking too much alcohol can cause injury, accidents, and health problems. These types of problems can include:   Car crashes.  Falls.  Family fighting (domestic violence).  Drowning.  Fights.  Injuries.  Burns.  Damage to certain organs.  Having a baby with birth defects. ONE DRINK CAN BE TOO MUCH WHEN YOU ARE:  Working.  Pregnant or breastfeeding.  Taking medicines. Ask your doctor.  Driving or planning to drive. If you or someone you know has a drinking problem, get help from a doctor.  Document Released: 04/25/2009 Document Revised: 09/21/2011 Document Reviewed: 04/25/2009 ExitCare Patient Information 2013 [employer].   Smoking Hazards Smoking cigarettes is extremely bad for your health. Tobacco smoke has over 200 known poisons in it. There are over 60 chemicals in tobacco smoke that cause cancer. Some of the chemicals found in cigarette smoke include:   Cyanide.  Benzene.  Formaldehyde.  Methanol (wood alcohol).  Acetylene (fuel used in welding torches).  Ammonia. Cigarette smoke also contains the poisonous gases nitrogen oxide and carbon monoxide.  Cigarette smokers have an increased risk of many serious medical problems and Smoking causes approximately:  90% of all lung cancer deaths in men.  80% of all lung cancer deaths in women.  90% of deaths from chronic obstructive lung disease. Compared with nonsmokers, smoking increases the risk of:  Coronary heart disease by 2 to 4 times.  Stroke by 2 to 4 times.  Men developing lung cancer by 23 times.  Women developing lung cancer by 13 times.  Dying from chronic obstructive lung diseases by 12 times.  . Smoking is the most preventable cause of death and disease in our society.  WHY IS SMOKING ADDICTIVE?  Nicotine is the chemical  agent in tobacco that is capable of causing addiction or dependence.  When you smoke and inhale, nicotine is absorbed rapidly into the bloodstream through your lungs. Nicotine absorbed through the lungs is capable of creating a powerful addiction. Both inhaled and non-inhaled nicotine may be addictive.  Addiction studies of cigarettes and spit tobacco show that addiction to nicotine occurs mainly during the teen years, when young people begin using tobacco products. WHAT ARE THE BENEFITS OF QUITTING?  There are many health benefits to quitting smoking.   Likelihood of developing cancer and heart disease decreases. Health improvements are seen almost immediately.  Blood pressure, pulse rate, and breathing patterns start returning to normal soon after quitting. QUITTING SMOKING   American Lung Association - 1-800-LUNGUSA  American Cancer Society - 1-800-ACS-2345 Document Released: 08/06/2004 Document Revised: 09/21/2011 Document Reviewed: 04/10/2009 ExitCare Patient Information 2013 ExitCare,   LLC.   Stress Management Stress is a state of physical or mental tension that often results from changes in your life or normal routine. Some common causes of stress are:  Death of a loved one.  Injuries or severe illnesses.  Getting fired or changing jobs.  Moving into a new home. Other causes may be:  Sexual problems.  Business or financial losses.  Taking on a large debt.  Regular conflict with someone at home or at work.  Constant tiredness from lack of sleep. It is not just bad things that are stressful. It may be stressful to:  Win the lottery.  Get married.  Buy a new car. The amount of stress that can be easily tolerated varies from person to person. Changes generally cause stress, regardless of the types of change. Too much stress can affect your health. It may lead to physical or emotional problems. Too little stress (boredom) may also become stressful. SUGGESTIONS TO  REDUCE STRESS:  Talk things over with your family and friends. It often is helpful to share your concerns and worries. If you feel your problem is serious, you may want to get help from a professional counselor.  Consider your problems one at a time instead of lumping them all together. Trying to take care of everything at once may seem impossible. List all the things you need to do and then start with the most important one. Set a goal to accomplish 2 or 3 things each day. If you expect to do too many in a single day you will naturally fail, causing you to feel even more stressed.  Do not use alcohol or drugs to relieve stress. Although you may feel better for a short time, they do not remove the problems that caused the stress. They can also be habit forming.  Exercise regularly - at least 3 times per week. Physical exercise can help to relieve that "uptight" feeling and will relax you.  The shortest distance between despair and hope is often a good night's sleep.  Go to bed and get up on time allowing yourself time for appointments without being rushed.  Take a short "time-out" period from any stressful situation that occurs during the day. Close your eyes and take some deep breaths. Starting with the muscles in your face, tense them, hold it for a few seconds, then relax. Repeat this with the muscles in your neck, shoulders, hand, stomach, back and legs.  Take good care of yourself. Eat a balanced diet and get plenty of rest.  Schedule time for having fun. Take a break from your daily routine to relax. HOME CARE INSTRUCTIONS   Call if you feel overwhelmed by your problems and feel you can no longer manage them on your own.  Return immediately if you feel like hurting yourself or someone else. Document Released: 12/23/2000 Document Revised: 09/21/2011 Document Reviewed: 08/15/2007 ExitCare Patient Information 2013 ExitCare, [employer].   

## 2013-07-25 NOTE — Progress Notes (Signed)
23 y.o. G0P0000 Single Caucasian Fe here for annual exam. Periods normal. Nuvaring working well. Partner change, desires STD screening. Complainig of urinary frequency and urgency for 3 days, denies fever,chills, or headache or back pain. Recent treatment for UTI one month ago with Macrobid with relief. UTI did not seem post coital per patient. Working on eating better and drinking more water and cutting back on soda. No other health issues today.  Patient's last menstrual period was 07/11/2013.          Sexually active: yes  The current method of family planning is NuvaRing vaginal inserts.    Exercising: no  exercise Smoker:  yes  Health Maintenance: Pap:  none MMG:  none Colonoscopy:  none BMD:   none TDaP:  2013 Labs: Poct urine , wbc 2+ Self breast exam: not done   reports that she has been smoking Cigarettes.  She has been smoking about 0.50 packs per day. She does not have any smokeless tobacco history on file. She reports that she does not drink alcohol or use illicit drugs.  Past Medical History  Diagnosis Date  . Anxiety   . Viral pneumonia August 2014  . Pneumomediastinum August 2014  . Substance abuse     past cocaine use    History reviewed. No pertinent past surgical history.  Current Outpatient Prescriptions  Medication Sig Dispense Refill  . acetaminophen (TYLENOL) 500 MG tablet Take 500 mg by mouth every 6 (six) hours as needed for pain.      Marland Kitchen. albuterol (PROVENTIL HFA;VENTOLIN HFA) 108 (90 BASE) MCG/ACT inhaler Inhale 2 puffs into the lungs every 4 (four) hours as needed for wheezing.      Marland Kitchen. etonogestrel-ethinyl estradiol (NUVARING) 0.12-0.015 MG/24HR vaginal ring Insert vaginally and leave in place for 3 consecutive weeks, then remove for 1 week.  3 each  4  . ibuprofen (ADVIL,MOTRIN) 200 MG tablet Take 400 mg by mouth every 6 (six) hours as needed for pain.       No current facility-administered medications for this visit.    Family History  Problem  Relation Age of Onset  . Hypertension Mother   . Diabetes Father   . Cancer Paternal Grandmother     liver/colon    ROS:  Pertinent items are noted in HPI.  Otherwise, a comprehensive ROS was negative.  Exam:   BP 118/71  Pulse 68  Resp 16  Ht 5' 3.5" (1.613 m)  Wt 117 lb (53.071 kg)  BMI 20.40 kg/m2  LMP 07/11/2013 Height: 5' 3.5" (161.3 cm)  Ht Readings from Last 3 Encounters:  07/25/13 5' 3.5" (1.613 m)  05/25/13 5\' 3"  (1.6 m)  03/14/13 5\' 4"  (1.626 m)    General appearance: alert, cooperative and appears stated age Head: Normocephalic, without obvious abnormality, atraumatic Neck: no adenopathy, supple, symmetrical, trachea midline and thyroid normal to inspection and palpation Lungs: clear to auscultation bilaterally Breasts: normal appearance, no masses or tenderness, No nipple retraction or dimpling, No nipple discharge or bleeding, No axillary or supraclavicular adenopathy Heart: regular rate and rhythm Abdomen: soft, non-tender; no masses,  no organomegaly Extremities: extremities normal, atraumatic, no cyanosis or edema Skin: Skin color, texture, turgor normal. No rashes or lesions Lymph nodes: Cervical, supraclavicular, and axillary nodes normal. No abnormal inguinal nodes palpated Neurologic: Grossly normal   Pelvic: External genitalia:  no lesions              Urethra:  normal appearing urethra with no masses, tenderness or lesions  Bartholin's and Skene's: normal                 Vagina: normal appearing vagina with normal color and discharge, no lesions              Cervix: normal , non tedner              Pap taken: no Bimanual Exam:  Uterus:  normal size, contour, position, consistency, mobility, non-tender and anteverted              Adnexa: normal adnexa and no mass, fullness, tenderness               Rectovaginal: Confirms               Anus: deferred  A:  Well Woman with normal exam  Contraception Nuvaring desired  R/O UTI  STD  screening  P:   Reviewed health and wellness pertinent to exam  Rx Nuvaring see order  WUJ:WJXBJ micro/culture Increase water and decrease soda intake. Reviewed warning signs and symptoms of UTI  Lab:GC,Chlamydia,HIV,RPR, Hgb  Pap smear as per guidelines   pap smear not taken today  counseled on breast self exam, STD prevention, HIV risk factors and prevention, adequate intake of calcium and vitamin D, diet and exercise  return annually or prn  An After Visit Summary was printed and given to the patient.

## 2013-07-26 LAB — URINE CULTURE

## 2013-07-26 LAB — URINALYSIS, MICROSCOPIC ONLY
Bacteria, UA: NONE SEEN
CASTS: NONE SEEN
Crystals: NONE SEEN
SQUAMOUS EPITHELIAL / LPF: NONE SEEN
WBC, UA: >50 WBC/hpf — AB (ref <3)

## 2013-07-26 LAB — RPR

## 2013-07-26 LAB — HIV ANTIBODY (ROUTINE TESTING W REFLEX): HIV: NONREACTIVE

## 2013-07-27 ENCOUNTER — Telehealth: Payer: Self-pay

## 2013-07-27 NOTE — Telephone Encounter (Signed)
Message copied by Eliezer BottomJOHNSON, Charon Akamine J on Thu Jul 27, 2013  3:30 PM ------      Message from: Verner CholLEONARD, DEBORAH S      Created: Thu Jul 27, 2013  8:44 AM       Notify patient that urine microscopic only showed WBC, no bacteria.      Urine culture no predominant bacteria to treatment, low count      Continue to increase water daily and decrease soda, if symptoms persist needs OV      RPR, HIV negative      GC,Chlamydia pending ------

## 2013-07-27 NOTE — Telephone Encounter (Signed)
lmtcb

## 2013-07-28 LAB — IPS N GONORRHOEA AND CHLAMYDIA BY PCR

## 2013-07-28 NOTE — Progress Notes (Signed)
Reviewed personally.  M. Suzanne Shaw Dobek, MD.  

## 2013-07-31 ENCOUNTER — Other Ambulatory Visit: Payer: Self-pay | Admitting: Certified Nurse Midwife

## 2013-07-31 ENCOUNTER — Telehealth: Payer: Self-pay | Admitting: Emergency Medicine

## 2013-07-31 DIAGNOSIS — A749 Chlamydial infection, unspecified: Secondary | ICD-10-CM

## 2013-07-31 MED ORDER — AZITHROMYCIN 1 G PO PACK: 1.0000 g | PACK | Freq: Once | ORAL | Status: DC

## 2013-07-31 NOTE — Telephone Encounter (Signed)
Spoke with patient. Message from Verner Choleborah S. Leonard CNM given.  Advised rx was sent to pharmacy, should take treatment as directed, ensure to take with food.   Stressed need to notify partner and abstain until treatment. To wait seven days after they have completed treatment. Rescreen scheduled for 10/30/13 with Verner Choleborah S. Leonard CNM.  Advised reportable condition and that report would be sent to health department.Confidential communicable disease [facility]-Part one [id number], faxed with fax confirmation received and original sent to medical records.  Patient did not have any questions regarding testing/treatment at this time. Advised to call back with any, she is agreeable and verbalized understanding for very important need for treatment and follow up.

## 2013-07-31 NOTE — Telephone Encounter (Signed)
Patient returned call, questions regarding last testing for STD's/Chlamydia.  Advised last GC/Chlamydia testing was negative in 05/2012.  Patient states she will call back with any further questions.

## 2013-07-31 NOTE — Telephone Encounter (Signed)
Pt called by triage to give results. Other results had come in for pt to be notified about

## 2013-07-31 NOTE — Telephone Encounter (Signed)
Message copied by Joeseph AmorFAST, Lexxi Koslow L on Mon Jul 31, 2013  9:57 AM ------      Message from: Leota SauersLEONARD, DEBORAH S      Created: Mon Jul 31, 2013  8:21 AM       Notify patient that Chlamydia showed positive and will need medication. Rx Azithromycin 1 gm  Order in Patient needs to take medication and return in 3 months for re-screen.Please schedule also.      Patient should notify partner and stress treatment should be obtained      GC negative      Reportable ------

## 2013-09-18 ENCOUNTER — Telehealth: Payer: Self-pay | Admitting: Certified Nurse Midwife

## 2013-09-18 NOTE — Telephone Encounter (Signed)
Spoke with pt who noticed bleeding like a period on Friday, then slowed to spotting the next day. Pt still spotting today. Pt has been on Nuvaring for about a year with no problems. Periods usually regular. Pt denies any new partners or STD concerns, as she was just treated for chlamydia in January. Pt just didn't know if this was to be expected on Nuvaring.

## 2013-09-18 NOTE — Telephone Encounter (Signed)
Spotting and breakthrough bleeding can occur occasionally with Nuvaring. Has she taken Nuvaring in and out in the past few days? This can cause bleeding or spotting occurrence. Any STD concerns this also can cause changes. If so needs OV to make sure no issues. Be sure to use Nuvaring as prescribed. If unsure needs OV

## 2013-09-18 NOTE — Telephone Encounter (Signed)
Patient is on nuvaring and just put a new one in 1 week ago and has been bleeding ( like she was starting her cycle) but it stopped and now has started spotting.

## 2013-09-18 NOTE — Telephone Encounter (Signed)
LMTCB, calling with message from Debbi.

## 2013-09-19 NOTE — Telephone Encounter (Signed)
LMTCB

## 2013-09-19 NOTE — Telephone Encounter (Signed)
Spoke with pt to advise that BTB or spotting can sometimes occur with Nuvaring, especially if she has just removed one. Advised that an STD can also cause BTB or spotting. Pt does not have an STD concern currently. Pt has appt in April, and will wait to see if next ring causes spotting. Pt will call back with heavier bleeding or any STD concern, and will otherwise be seen for her appt next month.

## 2013-10-30 ENCOUNTER — Ambulatory Visit (INDEPENDENT_AMBULATORY_CARE_PROVIDER_SITE_OTHER): Payer: BC Managed Care – PPO | Admitting: Certified Nurse Midwife

## 2013-10-30 ENCOUNTER — Encounter: Payer: Self-pay | Admitting: Certified Nurse Midwife

## 2013-10-30 VITALS — BP 90/60 | HR 64 | Resp 16 | Ht 63.5 in | Wt 128.0 lb

## 2013-10-30 DIAGNOSIS — Z202 Contact with and (suspected) exposure to infections with a predominantly sexual mode of transmission: Secondary | ICD-10-CM

## 2013-10-30 NOTE — Progress Notes (Signed)
22 y.o. Single Caucasian female G0P0000 here for follow up of Chlamydia treated with Azithromycin initiated on 07/31/13. Completed all medication as directed.  Denies any symptoms of increase vaginal discharge or odor. Partners ? Treated, patient did inform them. Contraception Nuvaring, working well. No health issues today.   O: Healthy WD,WN female Affect: normal, orientation x 3 Skin:warm and dry Abdomen:soft non tender Pelvic exam:EXTERNAL GENITALIA: normal appearing vulva with no masses, tenderness or lesions VAGINA: no abnormal discharge or lesions CERVIX: no lesions or cervical motion tenderness and non tender normal appearance UTERUS: normal,non tender ADNEXA: no masses palpable, nontender and normal  A:History of positive chlamydia with treatment with rescreen today Normal pelvic exam   P: Discussed findings of normal exam. Discussed of consistent condom use for protection of STD. Questions addressed.  Labs: GC,Chlamydia    RV prn, aex

## 2013-10-30 NOTE — Patient Instructions (Signed)
Sexually Transmitted Disease A sexually transmitted disease (STD) is a disease or infection that may be passed (transmitted) from person to person, usually during sexual activity. This may happen by way of saliva, semen, blood, vaginal mucus, or urine. Common STDs include:   Gonorrhea.   Chlamydia.   Syphilis.   HIV and AIDS.   Genital herpes.   Hepatitis B and C.   Trichomonas.   Human papillomavirus (HPV).   Pubic lice.   Scabies.  Mites.  Bacterial vaginosis. WHAT ARE CAUSES OF STDs? An STD may be caused by bacteria, a virus, or parasites. STDs are often transmitted during sexual activity if one person is infected. However, they may also be transmitted through nonsexual means. STDs may be transmitted after:   Sexual intercourse with an infected person.   Sharing sex toys with an infected person.   Sharing needles with an infected person or using unclean piercing or tattoo needles.  Having intimate contact with the genitals, mouth, or rectal areas of an infected person.   Exposure to infected fluids during birth. WHAT ARE THE SIGNS AND SYMPTOMS OF STDs? Different STDs have different symptoms. Some people may not have any symptoms. If symptoms are present, they may include:   Painful or bloody urination.   Pain in the pelvis, abdomen, vagina, anus, throat, or eyes.   Skin rash, itching, irritation, growths, sores (lesions), ulcerations, or warts in the genital or anal area.  Abnormal vaginal discharge with or without bad odor.   Penile discharge in men.   Fever.   Pain or bleeding during sexual intercourse.   Swollen glands in the groin area.   Yellow skin and eyes (jaundice). This is seen with hepatitis.   Swollen testicles.  Infertility.  Sores and blisters in the mouth. HOW ARE STDs DIAGNOSED? To make a diagnosis, your health care provider may:   Take a medical history.   Perform a physical exam.   Take a sample of any  discharge for examination.  Swab the throat, cervix, opening to the penis, rectum, or vagina for testing.  Test a sample of your first morning urine.   Perform blood tests.   Perform a Pap smear, if this applies.   Perform a colposcopy.   Perform a laparoscopy.  HOW ARE STDs TREATED? Treatment depends on the STD. Some STDs may be treated but not cured.   Chlamydia, gonorrhea, trichomonas, and syphilis can be cured with antibiotics.   Genital herpes, hepatitis, and HIV can be treated, but not cured, with prescribed medicines. The medicines lessen symptoms.   Genital warts from HPV can be treated with medicine or by freezing, burning (electrocautery), or surgery. Warts may come back.   HPV cannot be cured with medicine or surgery. However, abnormal areas may be removed from the cervix, vagina, or vulva.   If your diagnosis is confirmed, your recent sexual partners need treatment. This is true even if they are symptom-free or have a negative culture or evaluation. They should not have sex until their health care providers say it is OK. HOW CAN I REDUCE MY RISK OF GETTING AN STD?  Use latex condoms, dental dams, and water-soluble lubricants during sexual activity. Do not use petroleum jelly or oils.  Get vaccinated for HPV and hepatitis. If you have not received these vaccines in the past, talk to your health care provider about whether one or both might be right for you.   Avoid risky sex practices that can break the skin.  WHAT SHOULD   I DO IF I THINK I HAVE AN STD?  See your health care provider.   Inform all sexual partners. They should be tested and treated for any STDs.  Do not have sex until your health care provider says it is OK. WHEN SHOULD I GET HELP? Seek immediate medical care if:  You develop severe abdominal pain.  You are a man and notice swelling or pain in the testicles.  You are a woman and notice swelling or pain in your vagina. Document  Released: 09/19/2002 Document Revised: 04/19/2013 Document Reviewed: 01/17/2013 Exit[employer] Patient Information 2014 [employer].  

## 2013-11-02 LAB — IPS N GONORRHOEA AND CHLAMYDIA BY PCR

## 2013-11-06 NOTE — Progress Notes (Signed)
Reviewed personally.  M. Suzanne Brucha Ahlquist, MD.  

## 2013-11-09 ENCOUNTER — Telehealth: Payer: Self-pay | Admitting: Certified Nurse Midwife

## 2013-11-09 NOTE — Telephone Encounter (Signed)
Patient calling for recent results. °

## 2013-11-09 NOTE — Telephone Encounter (Signed)
Left message to call Daxx Tiggs at [phone].  Inform of results below  Notes Recorded by Araceli Boucheavina Johnson, CMA on 11/07/2013 at 12:01 PM Released to mychart  Notes Recorded by Lauro FranklinPatricia Rolen-Grubb, FNP on 11/02/2013 at 5:13 PM Let patient know negative GC and CHL

## 2013-11-13 NOTE — Telephone Encounter (Signed)
Left message to call Kaitlyn at [phone]. 

## 2013-11-13 NOTE — Telephone Encounter (Signed)
Spoke with patient. Results given as seen below. Patient agreeable and verbalizes understanding.  Routing to provider for final review. Patient agreeable to disposition. Will close encounter   

## 2014-01-24 ENCOUNTER — Encounter: Payer: Self-pay | Admitting: Nurse Practitioner

## 2014-01-24 ENCOUNTER — Ambulatory Visit (INDEPENDENT_AMBULATORY_CARE_PROVIDER_SITE_OTHER): Payer: BC Managed Care – PPO | Admitting: Nurse Practitioner

## 2014-01-24 VITALS — BP 120/70 | HR 72 | Ht 63.0 in | Wt 122.0 lb

## 2014-01-24 DIAGNOSIS — B3731 Acute candidiasis of vulva and vagina: Secondary | ICD-10-CM

## 2014-01-24 DIAGNOSIS — B373 Candidiasis of vulva and vagina: Secondary | ICD-10-CM

## 2014-01-24 MED ORDER — FLUCONAZOLE 150 MG PO TABS: 150.0000 mg | ORAL_TABLET | Freq: Once | ORAL | Status: DC

## 2014-01-24 NOTE — Progress Notes (Signed)
Subjective:     Patient ID: Valerie BradfordAlena Savona, female   DOB: 24-Oct-1990, 23 y.o.   MRN: [id number]  HPI   This 23 yo SW Fe complains of vaginal discharge  X 1 week. Vaginal discharge is white and some discomfort of burning.  New partner X 1 a week ago and used condoms .  She had some discomfort with SA afterwards.  Uses condoms and no history of condom irritation/ allergic reactions..  She uses Nuva ring for birth control.  No recent change in personal products.  LMP 01/17/14.   Review of Systems  Constitutional: Negative for fever, chills and fatigue.  Gastrointestinal: Negative for nausea, vomiting, abdominal pain, constipation and abdominal distention.  Genitourinary: Positive for vaginal discharge, vaginal pain and dyspareunia. Negative for dysuria, urgency, frequency and pelvic pain.  Skin: Negative.   Neurological: Negative.   Psychiatric/Behavioral: Positive for agitation.       Objective:   Physical Exam  Constitutional: She is oriented to person, place, and time. She appears well-developed and well-nourished.  Abdominal: Soft. She exhibits no distension. There is no tenderness. There is no rebound.  Genitourinary:  White thick vaginal discharge and external irritation consistent with yeast.  She also has mild pink spotting at the cervix.  Wet prep: PH: 4.0; KOH: + yeast; NSS: no clue cells. Few RBC.  Neurological: She is alert and oriented to person, place, and time.  Psychiatric: She has a normal mood and affect. Her behavior is normal. Judgment and thought content normal.       Assessment:     Yeast vaginitis    Plan:     Diflucan 150 mg X 2 tabs [facility] If symptoms worsen to call back.

## 2014-01-24 NOTE — Patient Instructions (Signed)

## 2014-01-25 NOTE — Progress Notes (Signed)
Note reviewed, agree with plan.  Marce Charlesworth, MD  

## 2014-04-23 IMAGING — CR DG CHEST 1V PORT
1 series · 1 of 1 positions shown · non-contrast
Comparison: 03/04/2013

CLINICAL DATA: Pneumomediastinum

PORTABLE CHEST - 1 VIEW

[AP]
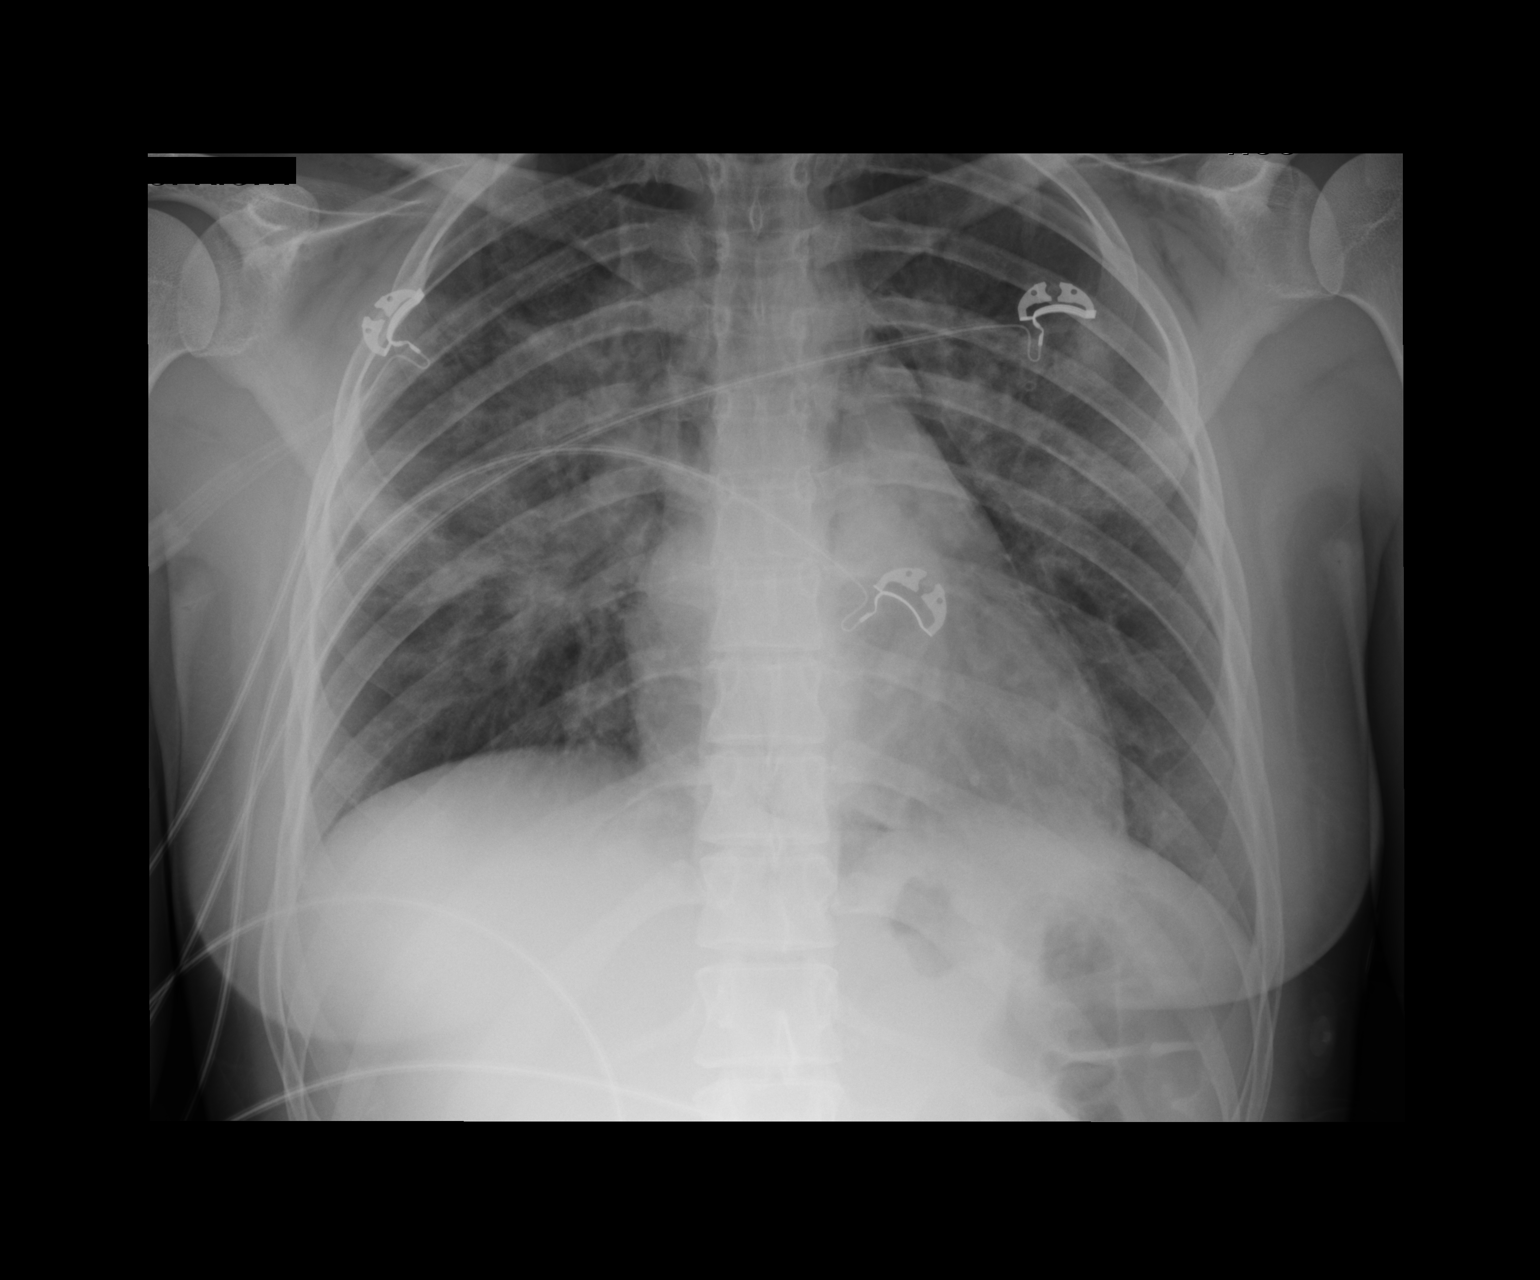

[1 of 1 positions shown; findings below may reference images not displayed]

FINDINGS: Pneumomediastinum and upper chest and neck base
subcutaneous emphysema is stable.

There is a tiny left apical pneumothorax, which appears decreased
from the previous day's exam.  The right apical pneumothorax is not
evident on the current study.

Patchy bilateral perihilar airspace infiltrates are stable.  No new
lung opacities.
IMPRESSION: Pneumomediastinum and subcutaneous emphysema is stable.

Tiny right pneumothorax not seen on the current exam.  Tiny left
pneumothorax has decreased in size.

Persistent bilateral perihilar infiltrates.

## 2014-07-26 ENCOUNTER — Ambulatory Visit: Payer: BC Managed Care – PPO | Admitting: Certified Nurse Midwife

## 2014-08-11 ENCOUNTER — Other Ambulatory Visit: Payer: Self-pay | Admitting: Certified Nurse Midwife

## 2014-08-13 NOTE — Telephone Encounter (Signed)
Medication refill request: Nuvaring Last AEX:  07/25/13 Next AEX: not scheduled  Last MMG (if hormonal medication request): None Refill authorized: 07/25/13 #3each/4R. Today #1/0R?  Called pt. Left voicemail to schedule AEX. [facility]

## 2015-01-29 ENCOUNTER — Other Ambulatory Visit: Payer: Self-pay | Admitting: Nurse Practitioner

## 2015-01-30 NOTE — Telephone Encounter (Signed)
Medication refill request: Nuvaring  Last AEX:  07/15/2013 with DL  Next AEX: No AEX scheduled Last MMG (if hormonal medication request): n/a Refill authorized: ?  Left Message To Call Back Re: Needs AEX scheduled

## 2015-02-01 NOTE — Telephone Encounter (Signed)
No refill until unless aex sched.

## 2015-02-01 NOTE — Telephone Encounter (Signed)
Called patient to try and schedule AEX and I left a message. Please advise Ms. Valerie Castro.

## 2015-02-27 ENCOUNTER — Other Ambulatory Visit: Payer: Self-pay | Admitting: Nurse Practitioner

## 2015-02-27 NOTE — Telephone Encounter (Deleted)
Medication refill request: Nuvaring  Last AEX:  07/25/13 with DL  Next AEX:  Last MMG (if hormonal medication request): *** Refill authorized: ***

## 2015-02-28 NOTE — Telephone Encounter (Addendum)
Medication refill request: Nuvaring  Last AEX:  07/25/13 with DL  Next AEX: No AEX scheduled for 2016 Last MMG (if hormonal medication request): n/a Refill authorized: ?  Left Message To Call Back

## 2015-03-04 NOTE — Telephone Encounter (Signed)
Called patient to schedule AEX no callback, please advise.

## 2015-03-05 ENCOUNTER — Other Ambulatory Visit: Payer: Self-pay | Admitting: Nurse Practitioner

## 2015-06-04 ENCOUNTER — Telehealth: Payer: Self-pay | Admitting: Certified Nurse Midwife

## 2015-06-04 NOTE — Telephone Encounter (Signed)
[facility] [address] Best # to reach patient [phone]

## 2015-06-04 NOTE — Telephone Encounter (Signed)
She needs OV before 1/17 for refill

## 2015-06-04 NOTE — Telephone Encounter (Signed)
Medication refill request: Nuvaring Last AEX:  07-25-13 Next AEX: 07-30-15 Last MMG (if hormonal medication request): N/A Refill authorized: please advise

## 2015-06-05 ENCOUNTER — Ambulatory Visit (INDEPENDENT_AMBULATORY_CARE_PROVIDER_SITE_OTHER): Payer: BLUE CROSS/BLUE SHIELD | Admitting: Certified Nurse Midwife

## 2015-06-05 ENCOUNTER — Encounter: Payer: Self-pay | Admitting: Certified Nurse Midwife

## 2015-06-05 VITALS — BP 112/78 | HR 70 | Resp 16 | Ht 63.5 in | Wt 135.0 lb

## 2015-06-05 DIAGNOSIS — Z3049 Encounter for surveillance of other contraceptives: Secondary | ICD-10-CM | POA: Diagnosis not present

## 2015-06-05 DIAGNOSIS — B373 Candidiasis of vulva and vagina: Secondary | ICD-10-CM

## 2015-06-05 DIAGNOSIS — Z113 Encounter for screening for infections with a predominantly sexual mode of transmission: Secondary | ICD-10-CM

## 2015-06-05 DIAGNOSIS — B3731 Acute candidiasis of vulva and vagina: Secondary | ICD-10-CM

## 2015-06-05 MED ORDER — ETONOGESTREL-ETHINYL ESTRADIOL 0.12-0.015 MG/24HR VA RING: 1.0000 | VAGINAL_RING | VAGINAL | Status: DC

## 2015-06-05 MED ORDER — HYLAFEM VA SUPP: 1.0000 | Freq: Every day | VAGINAL | Status: DC

## 2015-06-05 MED ORDER — NYSTATIN-TRIAMCINOLONE 100000-0.1 UNIT/GM-% EX CREA: 1.0000 "application " | TOPICAL_CREAM | Freq: Two times a day (BID) | CUTANEOUS | Status: AC

## 2015-06-05 NOTE — Progress Notes (Signed)
Reviewed personally.  M. Suzanne Joenathan Sakuma, MD.  

## 2015-06-05 NOTE — Patient Instructions (Signed)

## 2015-06-05 NOTE — Progress Notes (Signed)
24 y.o.Single white female g0p0 here with complaint of vaginal symptoms of itching, burning, and increase discharge. Describes discharge as white, small amount. External itching main concern.. Onset of symptoms 30 days ago. Denies new personal products. No  STD concerns but request GC,Chalmydia.. Urinary symptoms none . Contraception is Nuvaring, no issues. Use condoms too. No other health concerns. [place]. Has aex due in 1/17, needs one more Nuvaring refill until aex also.   O:Healthy female WDWN Affect: normal, orientation x 3  Exam: Abdomen: soft, non tender Lymph node: no enlargement or tenderness Pelvic exam: External genital: normal female with redness, scaling and exudate, no lesions noted Wet prep taken BUS: negative Vagina: scant milky discharge noted. Ph:4.0   ,Wet prep taken, Affirm taken for trichomonas screen,  Cervix: normal, non tender, no CMT Uterus: normal, non tender Adnexa:normal, non tender, no masses or fullness noted   Wet Prep results: positive for yeast vaginal and external genitalia   A:Normal pelvic exam Yeast vulvitis/vaginitis STD screening Contraception Nuvaring needs refill until aex   P:Discussed findings of yeast vaginitis/vulvitis and etiology. Discussed Aveeno or baking soda sitz bath for comfor. If working out in gym clothes  for long periods of time change underwear or bottoms of swimsuit if possible. Olive Oil/Coconut Oil use for skin protection prior to activity can be used to external skin. Rx: Hylafem see order with instructions given Rx Mycolog Labs: GC,Chlamydia, Affirm  Rv prn

## 2015-06-06 LAB — WET PREP BY MOLECULAR PROBE
Candida species: POSITIVE — AB
GARDNERELLA VAGINALIS: POSITIVE — AB
Trichomonas vaginosis: NEGATIVE

## 2015-06-11 LAB — IPS N GONORRHOEA AND CHLAMYDIA BY PCR

## 2015-06-29 ENCOUNTER — Other Ambulatory Visit: Payer: Self-pay | Admitting: Certified Nurse Midwife

## 2015-07-01 NOTE — Telephone Encounter (Signed)
Medication refill request: NuvaRing Last AEX:  07/25/2013 DL Next AEX: 40/98/119101/17/2017 DL Last MMG (if hormonal medication request): None Refill authorized: 06/05/2015 NuvaRing # 1 Ring 0 Refills  Today: Nuvaring #1 Ring

## 2015-07-16 ENCOUNTER — Other Ambulatory Visit: Payer: Self-pay | Admitting: Certified Nurse Midwife

## 2015-07-16 NOTE — Telephone Encounter (Signed)
Medication refill request: Nuvaring Last AEX: 07/25/13 DL Next AEX: 16/10/960401/17/2017 DL Last MMG (if hormonal medication request): NA Refill authorized: Nuvaring #1 0 Refills  Today: #1 0 Refills? Please advise

## 2015-07-30 ENCOUNTER — Encounter: Payer: Self-pay | Admitting: Certified Nurse Midwife

## 2015-07-30 ENCOUNTER — Ambulatory Visit (INDEPENDENT_AMBULATORY_CARE_PROVIDER_SITE_OTHER): Payer: BLUE CROSS/BLUE SHIELD | Admitting: Certified Nurse Midwife

## 2015-07-30 VITALS — BP 120/78 | HR 70 | Resp 16 | Ht 64.0 in | Wt 135.0 lb

## 2015-07-30 DIAGNOSIS — Z01419 Encounter for gynecological examination (general) (routine) without abnormal findings: Secondary | ICD-10-CM

## 2015-07-30 DIAGNOSIS — Z3049 Encounter for surveillance of other contraceptives: Secondary | ICD-10-CM | POA: Diagnosis not present

## 2015-07-30 DIAGNOSIS — Z205 Contact with and (suspected) exposure to viral hepatitis: Secondary | ICD-10-CM | POA: Diagnosis not present

## 2015-07-30 DIAGNOSIS — L298 Other pruritus: Secondary | ICD-10-CM | POA: Diagnosis not present

## 2015-07-30 DIAGNOSIS — Z124 Encounter for screening for malignant neoplasm of cervix: Secondary | ICD-10-CM

## 2015-07-30 DIAGNOSIS — Z0000 Encounter for general adult medical examination without abnormal findings: Secondary | ICD-10-CM | POA: Diagnosis not present

## 2015-07-30 DIAGNOSIS — N898 Other specified noninflammatory disorders of vagina: Secondary | ICD-10-CM

## 2015-07-30 MED ORDER — ETONOGESTREL-ETHINYL ESTRADIOL 0.12-0.015 MG/24HR VA RING: 1.0000 | VAGINAL_RING | VAGINAL | Status: AC

## 2015-07-30 NOTE — Progress Notes (Signed)
25 y.o. G0P0 Single  Caucasian Fe here for annual exam. Periods normal, no issues. Contraception Nuvaring working well. Desires serum STD screening only. Rosemarie Ax MD for aex/labs. Continues to have yeast like symptoms of external itching off and on. Uses OTC Monistat with some relief. No new personal products or STD concerns with vaginal symptoms. No thong use.  No other health concerns today.  Patient's last menstrual period was 07/04/2015.          Sexually active: Yes.    The current method of family planning is NuvaRing vaginal inserts.    Exercising: Yes.    yoga Smoker:  yes  Health Maintenance: Pap:  Never done MMG:  none Colonoscopy:  none BMD:   none TDaP:  2013 Shingles: no Pneumonia: no Hep C and HIV: HIV 2015 neg, Hep C not done Labs: hgb-13.3 Self breast exam: done occ   reports that she has been smoking Cigarettes.  She has been smoking about 0.50 packs per day. She does not have any smokeless tobacco history on file. She reports that she does not drink alcohol or use illicit drugs.  Past Medical History  Diagnosis Date  . Anxiety   . Viral pneumonia August 2014  . Pneumomediastinum [facility]) August 2014  . Substance abuse     past cocaine & heroin use    History reviewed. No pertinent past surgical history.  Current Outpatient Prescriptions  Medication Sig Dispense Refill  . acetaminophen (TYLENOL) 500 MG tablet Take 500 mg by mouth every 6 (six) hours as needed for pain.    Marland Kitchen albuterol (PROVENTIL HFA;VENTOLIN HFA) 108 (90 BASE) MCG/ACT inhaler Inhale 2 puffs into the lungs every 4 (four) hours as needed for wheezing.    Marland Kitchen NUVARING 0.12-0.015 MG/24HR vaginal ring PLACE 1 RING  VAGINALLY EVERY 28 (TWENTY-EIGHT) DAYS. INSERT VAGINALLYAND LEAVE IN PLACE FOR 3 CONSECUTIVE WEEKS, THEN REMOVE FOR 1 WEEK. 1 each 12  . ibuprofen (ADVIL,MOTRIN) 200 MG tablet Take 400 mg by mouth every 6 (six) hours as needed for pain.    Marland Kitchen nystatin-triamcinolone (MYCOLOG II) cream Apply 1  application topically 2 (two) times daily. Apply to affected area BID for up to 7 days. (Patient not taking: Reported on 07/30/2015) 60 g 0   No current facility-administered medications for this visit.    Family History  Problem Relation Age of Onset  . Hypertension Mother   . Diabetes Father   . Cancer Paternal Grandmother     liver/colon    ROS:  Pertinent items are noted in HPI.  Otherwise, a comprehensive ROS was negative.  Exam:   BP 120/78 mmHg  Pulse 70  Resp 16  Ht  (1.626 m)  Wt 135 lb (61.236 kg)  BMI 23.16 kg/m2  LMP 07/04/2015 Height:  (162.6 cm) Ht Readings from Last 3 Encounters:  07/30/15  (1.626 m)  06/05/15 5' 3.5" (1.613 m)  01/24/14  (1.6 m)    General appearance: alert, cooperative and appears stated age Head: Normocephalic, without obvious abnormality, atraumatic Neck: no adenopathy, supple, symmetrical, trachea midline and thyroid normal to inspection and palpation Lungs: clear to auscultation bilaterally Breasts: normal appearance, no masses or tenderness, No nipple retraction or dimpling, No nipple discharge or bleeding, No axillary or supraclavicular adenopathy Heart: regular rate and rhythm Abdomen: soft, non-tender; no masses,  no organomegaly Extremities: extremities normal, atraumatic, no cyanosis or edema Skin: Skin color, texture, turgor normal. No rashes or lesions Lymph nodes: Cervical, supraclavicular, and axillary  nodes normal. No abnormal inguinal nodes palpated Neurologic: Grossly normal   Pelvic: External genitalia:  no lesions              Urethra:  normal appearing urethra with no masses, tenderness or lesions              Bartholin's and Skene's: normal                 Vagina: normal appearing vagina with normal color and discharge, no lesions              Cervix: normal,non tender, no lesions              Pap taken: Yes.   Bimanual Exam:  Uterus:  normal size, contour, position, consistency, mobility,  non-tender              Adnexa: normal adnexa and no mass, fullness, tenderness               Rectovaginal: Confirms               Anus:  normal appearance  Chaperone present: yes  A:  Well Woman with normal exam  Contraception Nuvaring desires continuance  STD screening  R/o vaginal infection  P:   Reviewed health and wellness pertinent to exam  Rx Nuvaring see order  Lab HIV,Hep C,RPR  Discussed aveeno or baking soda for itching. Discussed with partner,if he is symptomatic.  Lab Affirm will treat if indicated  Pap smear as above with HPV reflex  counseled on breast self exam, STD prevention, HIV risk factors and prevention, adequate intake of calcium and vitamin D, diet and exercise  return annually or prn  An After Visit Summary was printed and given to the patient.

## 2015-07-30 NOTE — Patient Instructions (Signed)
General topics  Next pap or exam is  due in 1 year Take a Women's multivitamin Take 1200 mg. of calcium daily - prefer dietary If any concerns in interim to call back  Breast Self-Awareness Practicing breast self-awareness may pick up problems early, prevent significant medical complications, and possibly save your life. By practicing breast self-awareness, you can become familiar with how your breasts look and feel and if your breasts are changing. This allows you to notice changes early. It can also offer you some reassurance that your breast health is good. One way to learn what is normal for your breasts and whether your breasts are changing is to do a breast self-exam. If you find a lump or something that was not present in the past, it is best to contact your caregiver right away. Other findings that should be evaluated by your caregiver include nipple discharge, especially if it is bloody; skin changes or reddening; areas where the skin seems to be pulled in (retracted); or new lumps and bumps. Breast pain is seldom associated with cancer (malignancy), but should also be evaluated by a caregiver. BREAST SELF-EXAM The best time to examine your breasts is 5 7 days after your menstrual period is over.  ExitCare Patient Information 2013 [employer].   Exercise to Stay Healthy Exercise helps you become and stay healthy. EXERCISE IDEAS AND TIPS Choose exercises that:  You enjoy.  Fit into your day. You do not need to exercise really hard to be healthy. You can do exercises at a slow or medium level and stay healthy. You can:  Stretch before and after working out.  Try yoga, Pilates, or tai chi.  Lift weights.  Walk fast, swim, jog, run, climb stairs, bicycle, dance, or rollerskate.  Take aerobic classes. Exercises that burn about 150 calories:  Running 1  miles in 15 minutes.  Playing volleyball for 45 to 60 minutes.  Washing and waxing a car for 45 to 60  minutes.  Playing touch football for 45 minutes.  Walking 1  miles in 35 minutes.  Pushing a stroller 1  miles in 30 minutes.  Playing basketball for 30 minutes.  Raking leaves for 30 minutes.  Bicycling 5 miles in 30 minutes.  Walking 2 miles in 30 minutes.  Dancing for 30 minutes.  Shoveling snow for 15 minutes.  Swimming laps for 20 minutes.  Walking up stairs for 15 minutes.  Bicycling 4 miles in 15 minutes.  Gardening for 30 to 45 minutes.  Jumping rope for 15 minutes.  Washing windows or floors for 45 to 60 minutes. Document Released: 08/01/2010 Document Revised: 09/21/2011 Document Reviewed: 08/01/2010 ExitCare Patient Information 2013 [employer].   Other topics ( that may be useful information):    Sexually Transmitted Disease Sexually transmitted disease (STD) refers to any infection that is passed from person to person during sexual activity. This may happen by way of saliva, semen, blood, vaginal mucus, or urine. Common STDs include:  Gonorrhea.  Chlamydia.  Syphilis.  HIV/AIDS.  Genital herpes.  Hepatitis B and C.  Trichomonas.  Human papillomavirus (HPV).  Pubic lice. CAUSES  An STD may be spread by bacteria, virus, or parasite. A person can get an STD by:  Sexual intercourse with an infected person.  Sharing sex toys with an infected person.  Sharing needles with an infected person.  Having intimate contact with the genitals, mouth, or rectal areas of an infected person. SYMPTOMS  Some people may not have any symptoms, but   they can still pass the infection to others. Different STDs have different symptoms. Symptoms include:  Painful or bloody urination.  Pain in the pelvis, abdomen, vagina, anus, throat, or eyes.  Skin rash, itching, irritation, growths, or sores (lesions). These usually occur in the genital or anal area.  Abnormal vaginal discharge.  Penile discharge in men.  Soft, flesh-colored skin growths in the  genital or anal area.  Fever.  Pain or bleeding during sexual intercourse.  Swollen glands in the groin area.  Yellow skin and eyes (jaundice). This is seen with hepatitis. DIAGNOSIS  To make a diagnosis, your caregiver may:  Take a medical history.  Perform a physical exam.  Take a specimen (culture) to be examined.  Examine a sample of discharge under a microscope.  Perform blood test TREATMENT   Chlamydia, gonorrhea, trichomonas, and syphilis can be cured with antibiotic medicine.  Genital herpes, hepatitis, and HIV can be treated, but not cured, with prescribed medicines. The medicines will lessen the symptoms.  Genital warts from HPV can be treated with medicine or by freezing, burning (electrocautery), or surgery. Warts may come back.  HPV is a virus and cannot be cured with medicine or surgery.However, abnormal areas may be followed very closely by your caregiver and may be removed from the cervix, vagina, or vulva through office procedures or surgery. If your diagnosis is confirmed, your recent sexual partners need treatment. This is true even if they are symptom-free or have a negative culture or evaluation. They should not have sex until their caregiver says it is okay. HOME CARE INSTRUCTIONS  All sexual partners should be informed, tested, and treated for all STDs.  Take your antibiotics as directed. Finish them even if you start to feel better.  Only take over-the-counter or prescription medicines for pain, discomfort, or fever as directed by your caregiver.  Rest.  Eat a balanced diet and drink enough fluids to keep your urine clear or pale yellow.  Do not have sex until treatment is completed and you have followed up with your caregiver. STDs should be checked after treatment.  Keep all follow-up appointments, Pap tests, and blood tests as directed by your caregiver.  Only use latex condoms and water-soluble lubricants during sexual activity. Do not use  petroleum jelly or oils.  Avoid alcohol and illegal drugs.  Get vaccinated for HPV and hepatitis. If you have not received these vaccines in the past, talk to your caregiver about whether one or both might be right for you.  Avoid risky sex practices that can break the skin. The only way to avoid getting an STD is to avoid all sexual activity.Latex condoms and dental dams (for oral sex) will help lessen the risk of getting an STD, but will not completely eliminate the risk. SEEK MEDICAL CARE IF:   You have a fever.  You have any new or worsening symptoms. Document Released: 09/19/2002 Document Revised: 09/21/2011 Document Reviewed: 09/26/2010 [facility] Patient Information 2013 [employer].    Domestic Abuse You are being battered or abused if someone close to you hits, pushes, or physically hurts you in any way. You also are being abused if you are forced into activities. You are being sexually abused if you are forced to have sexual contact of any kind. You are being emotionally abused if you are made to feel worthless or if you are constantly threatened. It is important to remember that help is available. No one has the right to abuse you. PREVENTION OF FURTHER  ABUSE  Learn the warning signs of danger. This varies with situations but may include: the use of alcohol, threats, isolation from friends and family, or forced sexual contact. Leave if you feel that violence is going to occur.  If you are attacked or beaten, report it to the police so the abuse is documented. You do not have to press charges. The police can protect you while you or the attackers are leaving. Get the officer's name and badge number and a copy of the report.  Find someone you can trust and tell them what is happening to you: your caregiver, a nurse, clergy member, close friend or family member. Feeling ashamed is natural, but remember that you have done nothing wrong. No one deserves abuse. Document Released:  06/26/2000 Document Revised: 09/21/2011 Document Reviewed: 09/04/2010 ExitCare Patient Information 2013 [employer].    How Much is Too Much Alcohol? Drinking too much alcohol can cause injury, accidents, and health problems. These types of problems can include:   Car crashes.  Falls.  Family fighting (domestic violence).  Drowning.  Fights.  Injuries.  Burns.  Damage to certain organs.  Having a baby with birth defects. ONE DRINK CAN BE TOO MUCH WHEN YOU ARE:  Working.  Pregnant or breastfeeding.  Taking medicines. Ask your doctor.  Driving or planning to drive. If you or someone you know has a drinking problem, get help from a doctor.  Document Released: 04/25/2009 Document Revised: 09/21/2011 Document Reviewed: 04/25/2009 ExitCare Patient Information 2013 [employer].   Smoking Hazards Smoking cigarettes is extremely bad for your health. Tobacco smoke has over 200 known poisons in it. There are over 60 chemicals in tobacco smoke that cause cancer. Some of the chemicals found in cigarette smoke include:   Cyanide.  Benzene.  Formaldehyde.  Methanol (wood alcohol).  Acetylene (fuel used in welding torches).  Ammonia. Cigarette smoke also contains the poisonous gases nitrogen oxide and carbon monoxide.  Cigarette smokers have an increased risk of many serious medical problems and Smoking causes approximately:  90% of all lung cancer deaths in men.  80% of all lung cancer deaths in women.  90% of deaths from chronic obstructive lung disease. Compared with nonsmokers, smoking increases the risk of:  Coronary heart disease by 2 to 4 times.  Stroke by 2 to 4 times.  Men developing lung cancer by 23 times.  Women developing lung cancer by 13 times.  Dying from chronic obstructive lung diseases by 12 times.  . Smoking is the most preventable cause of death and disease in our society.  WHY IS SMOKING ADDICTIVE?  Nicotine is the chemical  agent in tobacco that is capable of causing addiction or dependence.  When you smoke and inhale, nicotine is absorbed rapidly into the bloodstream through your lungs. Nicotine absorbed through the lungs is capable of creating a powerful addiction. Both inhaled and non-inhaled nicotine may be addictive.  Addiction studies of cigarettes and spit tobacco show that addiction to nicotine occurs mainly during the teen years, when young people begin using tobacco products. WHAT ARE THE BENEFITS OF QUITTING?  There are many health benefits to quitting smoking.   Likelihood of developing cancer and heart disease decreases. Health improvements are seen almost immediately.  Blood pressure, pulse rate, and breathing patterns start returning to normal soon after quitting. QUITTING SMOKING   American Lung Association - 1-800-LUNGUSA  American Cancer Society - 1-800-ACS-2345 Document Released: 08/06/2004 Document Revised: 09/21/2011 Document Reviewed: 04/10/2009 ExitCare Patient Information 2013 ExitCare[employer]   LL[employer].   Stress Management Stress is a state of physical or mental tension that often results from changes in your life or normal routine. Some common causes of stress are:  Death of a loved one.  Injuries or severe illnesses.  Getting fired or changing jobs.  Moving into a new home. Other causes may be:  Sexual problems.  Business or financial losses.  Taking on a large debt.  Regular conflict with someone at home or at work.  Constant tiredness from lack of sleep. It is not just bad things that are stressful. It may be stressful to:  Win the lottery.  Get married.  Buy a new car. The amount of stress that can be easily tolerated varies from person to person. Changes generally cause stress, regardless of the types of change. Too much stress can affect your health. It may lead to physical or emotional problems. Too little stress (boredom) may also become stressful. SUGGESTIONS TO  REDUCE STRESS:  Talk things over with your family and friends. It often is helpful to share your concerns and worries. If you feel your problem is serious, you may want to get help from a professional counselor.  Consider your problems one at a time instead of lumping them all together. Trying to take care of everything at once may seem impossible. List all the things you need to do and then start with the most important one. Set a goal to accomplish 2 or 3 things each day. If you expect to do too many in a single day you will naturally fail, causing you to feel even more stressed.  Do not use alcohol or drugs to relieve stress. Although you may feel better for a short time, they do not remove the problems that caused the stress. They can also be habit forming.  Exercise regularly - at least 3 times per week. Physical exercise can help to relieve that "uptight" feeling and will relax you.  The shortest distance between despair and hope is often a good night's sleep.  Go to bed and get up on time allowing yourself time for appointments without being rushed.  Take a short "time-out" period from any stressful situation that occurs during the day. Close your eyes and take some deep breaths. Starting with the muscles in your face, tense them, hold it for a few seconds, then relax. Repeat this with the muscles in your neck, shoulders, hand, stomach, back and legs.  Take good care of yourself. Eat a balanced diet and get plenty of rest.  Schedule time for having fun. Take a break from your daily routine to relax. HOME CARE INSTRUCTIONS   Call if you feel overwhelmed by your problems and feel you can no longer manage them on your own.  Return immediately if you feel like hurting yourself or someone else. Document Released: 12/23/2000 Document Revised: 09/21/2011 Document Reviewed: 08/15/2007 ExitCare Patient Information 2013 ExitCare[employer].   Monilial Vaginitis Vaginitis in a soreness, swelling  and redness (inflammation) of the vagina and vulva. Monilial vaginitis is not a sexually transmitted infection. CAUSES  Yeast vaginitis is caused by yeast (candida) that is normally found in your vagina. With a yeast infection, the candida has overgrown in number to a point that upsets the chemical balance. SYMPTOMS   White, thick vaginal discharge.  Swelling, itching, redness and irritation of the vagina and possibly the lips of the vagina (vulva).  Burning or painful urination.  Painful intercourse. DIAGNOSIS  Things that may contribute to   monilial vaginitis are:  Postmenopausal and virginal states.  Pregnancy.  Infections.  Being tired, sick or stressed, especially if you had monilial vaginitis in the past.  Diabetes. Good control will help lower the chance.  Birth control pills.  Tight fitting garments.  Using bubble bath, feminine sprays, douches or deodorant tampons.  Taking certain medications that kill germs (antibiotics).  Sporadic recurrence can occur if you become ill. TREATMENT  Your caregiver will give you medication.  There are several kinds of anti monilial vaginal creams and suppositories specific for monilial vaginitis. For recurrent yeast infections, use a suppository or cream in the vagina 2 times a week, or as directed.  Anti-monilial or steroid cream for the itching or irritation of the vulva may also be used. Get your caregiver's permission.  Painting the vagina with methylene blue solution may help if the monilial cream does not work.  Eating yogurt may help prevent monilial vaginitis. HOME CARE INSTRUCTIONS   Finish all medication as prescribed.  Do not have sex until treatment is completed or after your caregiver tells you it is okay.  Take warm sitz baths.  Do not douche.  Do not use tampons, especially scented ones.  Wear cotton underwear.  Avoid tight pants and panty hose.  Tell your sexual partner that you have a yeast  infection. They should go to their caregiver if they have symptoms such as mild rash or itching.  Your sexual partner should be treated as well if your infection is difficult to eliminate.  Practice safer sex. Use condoms.  Some vaginal medications cause latex condoms to fail. Vaginal medications that harm condoms are:  Cleocin cream.  Butoconazole (Femstat).  Terconazole (Terazol) vaginal suppository.  Miconazole (Monistat) (may be purchased over the counter). SEEK MEDICAL CARE IF:   You have a temperature by mouth above 102 F (38.9 C).  The infection is getting worse after 2 days of treatment.  The infection is not getting better after 3 days of treatment.  You develop blisters in or around your vagina.  You develop vaginal bleeding, and it is not your menstrual period.  You have pain when you urinate.  You develop intestinal problems.  You have pain with sexual intercourse.   This information is not intended to replace advice given to you by your health care provider. Make sure you discuss any questions you have with your health care provider.   Document Released: 04/08/2005 Document Revised: 09/21/2011 Document Reviewed: 12/31/2014 Elsevier Interactive Patient Education 2016 [employer] Inc.  

## 2015-07-31 ENCOUNTER — Telehealth: Payer: Self-pay

## 2015-07-31 LAB — RPR

## 2015-07-31 LAB — HEPATITIS C ANTIBODY: HCV Ab: REACTIVE — AB

## 2015-07-31 LAB — WET PREP BY MOLECULAR PROBE
CANDIDA SPECIES: POSITIVE — AB
Gardnerella vaginalis: NEGATIVE
TRICHOMONAS VAG: NEGATIVE

## 2015-07-31 LAB — HIV ANTIBODY (ROUTINE TESTING W REFLEX): HIV 1&2 Ab, 4th Generation: NONREACTIVE

## 2015-07-31 NOTE — Telephone Encounter (Signed)
lmtcb

## 2015-07-31 NOTE — Telephone Encounter (Signed)
-----   Message from Verner Chol, CNM sent at 07/31/2015  7:27 AM EST ----- Notify patient affirm picked only yeast, Bv and trichomonas negative She has Rx complete as directed All other labs pending

## 2015-08-01 LAB — IPS PAP TEST WITH REFLEX TO HPV

## 2015-08-01 NOTE — Telephone Encounter (Signed)
Left message for call back.

## 2015-08-02 LAB — HEPATITIS C RNA QUANTITATIVE: HCV QUANT: NOT DETECTED [IU]/mL (ref <15)

## 2015-08-02 NOTE — Telephone Encounter (Signed)
Left message for call back.

## 2015-08-05 LAB — HEMOGLOBIN, FINGERSTICK: HEMOGLOBIN, FINGERSTICK: 13.3 g/dL (ref 12.0–16.0)

## 2015-08-05 NOTE — Progress Notes (Signed)
Reviewed personally.  M. Suzanne Melvern Ramone, MD.  

## 2015-08-06 NOTE — Telephone Encounter (Signed)
Left message to call back  

## 2015-08-06 NOTE — Telephone Encounter (Signed)
Left message for call back.

## 2015-08-06 NOTE — Telephone Encounter (Signed)
Patient returned call

## 2015-08-07 NOTE — Telephone Encounter (Signed)
Left message to call back  

## 2015-08-07 NOTE — Telephone Encounter (Signed)
Patient notified of results as written by provider 

## 2015-08-07 NOTE — Telephone Encounter (Signed)
Patient returning your call. [phone]

## 2015-09-03 NOTE — Telephone Encounter (Addendum)
Patient was seen 06/05/2015 and 07/30/15 Ms. Valerie Castro can you close encounter?

## 2016-07-27 ENCOUNTER — Telehealth: Payer: Self-pay | Admitting: Certified Nurse Midwife

## 2016-07-27 NOTE — Telephone Encounter (Signed)
Patient called and cancelled her AEX on 07/30/16.  She has moved to [place] and will continue care there.  She is in an 08 recall.  Routing to provider for FYI.

## 2016-07-30 ENCOUNTER — Ambulatory Visit: Payer: BLUE CROSS/BLUE SHIELD | Admitting: Certified Nurse Midwife

## 2016-08-10 NOTE — Telephone Encounter (Signed)
Okay to close encounter or is further follow up needed? °

## 2016-08-11 NOTE — Telephone Encounter (Signed)
Left message to call back  

## 2016-08-11 NOTE — Telephone Encounter (Signed)
She needs to make sure she has follow pap smear this year, due to abnormal. Was due this month.

## 2016-08-12 ENCOUNTER — Telehealth: Payer: Self-pay | Admitting: *Deleted

## 2016-08-12 NOTE — Telephone Encounter (Signed)
Patient in 08 recall for 07/2016. She has moved to [place]. (see phone note in record) Please advise on recall status Roosvelt Maserhanks-eh

## 2016-08-12 NOTE — Telephone Encounter (Signed)
Ok to remove from recall

## 2016-08-12 NOTE — Telephone Encounter (Signed)
Patient notified & told to have her new office contact us for her records.

## 2016-08-12 NOTE — Telephone Encounter (Signed)
Removed from recall -eh 

## 2016-10-30 ENCOUNTER — Other Ambulatory Visit: Payer: Self-pay | Admitting: Certified Nurse Midwife

## 2016-10-30 DIAGNOSIS — Z3049 Encounter for surveillance of other contraceptives: Secondary | ICD-10-CM

## 2016-11-02 ENCOUNTER — Other Ambulatory Visit: Payer: Self-pay | Admitting: Certified Nurse Midwife

## 2016-11-02 DIAGNOSIS — Z3049 Encounter for surveillance of other contraceptives: Secondary | ICD-10-CM

## 2016-11-02 NOTE — Telephone Encounter (Signed)
Since January she was finding a new GYN as she needs a repeat pap.  Now still not GYN.  She may not have a refill on Nuva Ring.  That should help her to get motivated to establish care.

## 2016-11-02 NOTE — Telephone Encounter (Signed)
Medication refill request: Etonogestrel-Ethinyl Estradiol Last AEX:  07/30/15 DL Next AEX: Not scheduled  Last MMG (if hormonal medication request): n/a Refill authorized: 07/30/15 #3 Each 4R. Please advise. Thank you.   Patient moved to [place]. She is working on finding a new gynecologist.   Routing to [facility] since DL is out of the office today.

## 2016-11-03 ENCOUNTER — Other Ambulatory Visit: Payer: Self-pay | Admitting: Certified Nurse Midwife

## 2016-11-03 DIAGNOSIS — Z3049 Encounter for surveillance of other contraceptives: Secondary | ICD-10-CM
